# Patient Record
Sex: Female | Born: 1965 | Race: White | Hispanic: No | Marital: Married | Smoking: Former smoker
Health system: Southern US, Community
[De-identification: ages and names within clinical notes are randomized; demographics above are authoritative.]

## PROBLEM LIST (undated history)

## (undated) DIAGNOSIS — R2231 Localized swelling, mass and lump, right upper limb: Secondary | ICD-10-CM

## (undated) DIAGNOSIS — F419 Anxiety disorder, unspecified: Secondary | ICD-10-CM

## (undated) DIAGNOSIS — K635 Polyp of colon: Secondary | ICD-10-CM

## (undated) DIAGNOSIS — G5601 Carpal tunnel syndrome, right upper limb: Secondary | ICD-10-CM

## (undated) DIAGNOSIS — Z973 Presence of spectacles and contact lenses: Secondary | ICD-10-CM

## (undated) DIAGNOSIS — M8580 Other specified disorders of bone density and structure, unspecified site: Secondary | ICD-10-CM

## (undated) HISTORY — PX: TONSILLECTOMY: SUR1361

## (undated) HISTORY — DX: Other specified disorders of bone density and structure, unspecified site: M85.80

## (undated) HISTORY — DX: Polyp of colon: K63.5

## (undated) HISTORY — DX: Anxiety disorder, unspecified: F41.9

---

## 1998-10-25 ENCOUNTER — Inpatient Hospital Stay (HOSPITAL_COMMUNITY)
Admission: AD | Admit: 1998-10-25 | Discharge: 1998-10-25 | Payer: Self-pay | Admitting: Physical Medicine & Rehabilitation

## 1998-11-06 ENCOUNTER — Inpatient Hospital Stay (HOSPITAL_COMMUNITY): Admission: AD | Admit: 1998-11-06 | Discharge: 1998-11-10 | Payer: Self-pay | Admitting: Obstetrics & Gynecology

## 1998-12-13 ENCOUNTER — Other Ambulatory Visit: Admission: RE | Admit: 1998-12-13 | Discharge: 1998-12-13 | Payer: Self-pay | Admitting: Obstetrics and Gynecology

## 1998-12-25 ENCOUNTER — Inpatient Hospital Stay (HOSPITAL_COMMUNITY): Admission: AD | Admit: 1998-12-25 | Discharge: 1998-12-25 | Payer: Self-pay | Admitting: Obstetrics and Gynecology

## 2000-05-29 ENCOUNTER — Other Ambulatory Visit: Admission: RE | Admit: 2000-05-29 | Discharge: 2000-05-29 | Payer: Self-pay | Admitting: Obstetrics and Gynecology

## 2001-08-11 ENCOUNTER — Other Ambulatory Visit: Admission: RE | Admit: 2001-08-11 | Discharge: 2001-08-11 | Payer: Self-pay | Admitting: Obstetrics and Gynecology

## 2002-04-07 ENCOUNTER — Encounter (INDEPENDENT_AMBULATORY_CARE_PROVIDER_SITE_OTHER): Payer: Self-pay | Admitting: Specialist

## 2002-04-07 ENCOUNTER — Observation Stay (HOSPITAL_COMMUNITY): Admission: RE | Admit: 2002-04-07 | Discharge: 2002-04-08 | Payer: Self-pay | Admitting: Physical Therapy

## 2002-04-07 HISTORY — PX: LAPAROSCOPIC ASSISTED VAGINAL HYSTERECTOMY: SHX5398

## 2003-06-16 ENCOUNTER — Other Ambulatory Visit: Admission: RE | Admit: 2003-06-16 | Discharge: 2003-06-16 | Payer: Self-pay | Admitting: Obstetrics and Gynecology

## 2004-07-03 ENCOUNTER — Other Ambulatory Visit: Admission: RE | Admit: 2004-07-03 | Discharge: 2004-07-03 | Payer: Self-pay | Admitting: Obstetrics and Gynecology

## 2004-08-05 ENCOUNTER — Ambulatory Visit (HOSPITAL_COMMUNITY): Admission: RE | Admit: 2004-08-05 | Discharge: 2004-08-05 | Payer: Self-pay | Admitting: Internal Medicine

## 2007-07-06 ENCOUNTER — Ambulatory Visit (HOSPITAL_COMMUNITY): Admission: RE | Admit: 2007-07-06 | Discharge: 2007-07-06 | Payer: Self-pay | Admitting: Internal Medicine

## 2007-07-09 ENCOUNTER — Ambulatory Visit (HOSPITAL_COMMUNITY): Admission: RE | Admit: 2007-07-09 | Discharge: 2007-07-09 | Payer: Self-pay | Admitting: Internal Medicine

## 2007-08-09 ENCOUNTER — Ambulatory Visit (HOSPITAL_COMMUNITY): Admission: RE | Admit: 2007-08-09 | Discharge: 2007-08-09 | Payer: Self-pay | Admitting: Internal Medicine

## 2007-08-27 ENCOUNTER — Encounter: Payer: Self-pay | Admitting: Endocrinology

## 2007-09-24 ENCOUNTER — Encounter: Payer: Self-pay | Admitting: Endocrinology

## 2007-10-07 ENCOUNTER — Ambulatory Visit: Payer: Self-pay | Admitting: Endocrinology

## 2007-10-07 DIAGNOSIS — D3500 Benign neoplasm of unspecified adrenal gland: Secondary | ICD-10-CM | POA: Insufficient documentation

## 2007-10-11 ENCOUNTER — Ambulatory Visit: Payer: Self-pay | Admitting: Endocrinology

## 2007-10-12 ENCOUNTER — Telehealth: Payer: Self-pay | Admitting: Endocrinology

## 2007-10-13 ENCOUNTER — Ambulatory Visit: Payer: Self-pay | Admitting: Cardiology

## 2007-10-13 LAB — CONVERTED CEMR LAB: Cortisol, Plasma: 1.3 ug/dL

## 2007-10-15 LAB — CONVERTED CEMR LAB: Aldosterone, Serum: 1

## 2007-10-18 LAB — CONVERTED CEMR LAB
Metaneph Total, Ur: 251 ug/24hr (ref 182–739)
Metanephrines, Ur: 93 (ref 58–203)
Normetanephrine, 24H Ur: 158 (ref 88–649)

## 2010-11-15 NOTE — Discharge Summary (Signed)
   NAME:  Dominique Sandoval, Dominique Sandoval                         ACCOUNT NO.:  [id number]   MEDICAL RECORD NO.:  [id number]                   PATIENT TYPE:  OBV   LOCATION:  9303                                 FACILITY:  [facility]   PHYSICIAN:  Dineen Kid. Rana Snare, M.D.                 DATE OF BIRTH:  01/09/1966   DATE OF ADMISSION:  04/07/2002  DATE OF DISCHARGE:  04/08/2002                                 DISCHARGE SUMMARY   HISTORY OF PRESENT ILLNESS:  The patient is a 45 year old with worsening  pelvic pain, dyspareunia, and abnormal uterine bleeding and presumed  fibroids presented for definitive surgery, planned laparoscopic-assisted  vaginal hysterectomy.   HOSPITAL COURSE:  The patient underwent a laparoscopic-assisted vaginal  hysterectomy which was uncomplicated.  At the same time a left salpingectomy  was performed due to a left paratubal cyst.  Estimated blood loss during the  surgery was 100 cc.  Her postoperative course was unremarkable with good  return of bowel function.  She was ambulating, tolerating a regular diet.  On postoperative day #1, her postoperative hemoglobin was 11.7, abdomen was  soft and nontender.  Incision was clean, dry, and intact and good active  bowel sounds.  The patient was discharged home.   DISPOSITION:  The patient will be discharged home to follow up in the office  in 1-2 weeks.   DISCHARGE INSTRUCTIONS:  Told to return for increasing pain, bleeding, or  fever greater than 100.5.   DISCHARGE MEDICATIONS:  She was sent home with a prescription for Tylox,  dispense #30.                                               Dineen Kid Rana Snare, M.D.    DCL/MEDQ  D:  04/08/2002  T:  04/09/2002  Job:  [id number]

## 2010-11-15 NOTE — H&P (Signed)
   NAME:  Dominique Sandoval, Dominique Sandoval                         ACCOUNT NO.:  [id number]   MEDICAL RECORD NO.:  [id number]                   PATIENT TYPE:  AMB   LOCATION:  SDC                                  FACILITY:  [facility]   PHYSICIAN:  Dineen Kid. Rana Snare, M.D.                 DATE OF BIRTH:  1966/02/14   DATE OF ADMISSION:  04/07/2002  DATE OF DISCHARGE:                                HISTORY & PHYSICAL   HISTORY OF PRESENT ILLNESS:  The patient is a 45 year old gravida 1, para 1,  with worsening abnormal uterine bleeding, pelvic pain, and dyspareunia.  She  has tried multiple oral contraceptive agents and continues to change pads  three to four times a day.  Ranges from this to more nuisance bleeding. She  continues to have pain which is intermittent, but definite dyspareunia which  has prevented her from being sexually active.  She has had an ultrasound in  the past which shows an enlarged uterus, question of underlying fibroids, a  normal sonohysterogram.  She has failed conservative therapy with oral  contraceptive agents and anti-inflammatory medications, has no future  childbearing desires, and presents today for definitive surgical treatment.  Plans hysterectomy.   PAST MEDICAL HISTORY:  Negative.   PAST SURGICAL HISTORY:  She had a tonsillectomy at age 29 and she had a  cesarean section.   MEDICATIONS:  1. Triphasil.  2. Anti-inflammatory medications.   ALLERGIES:  No known drug allergies.   PHYSICAL EXAMINATION:  VITAL SIGNS: Blood pressure 110/64.  HEART: Regular rate and rhythm.  LUNGS:  Clear to auscultation bilaterally.  ABDOMEN: Nondistended and nontender.  PELVIC:  Uterus is anteverted, mobile, 8 to 10 weeks size with irregular  surface. No adnexal masses are palpable. She does have mild tenderness to  deep palpation. The cervix is grossly normal in appearance.   IMPRESSION:  Enlarged uterus, probable fibroids, abnormal uterine bleeding,  pelvic pain and dyspareunia not  responsive to conservative medical  management.  The patient desires definitive surgical intervention, planned  laparoscopically-assisted vaginal hysterectomy with preservation of the  ovaries.  The risks and benefits were discussed at length which include, but  not limited to, risk of infection, bleeding, damage to bowel, bladder, or  ureters, possibly not being able to alleviate the pelvic pain, risks  associated with blood transfusion and anesthesia.  The patient does give her  informed consent.                                               Dineen Kid Rana Snare, M.D.    DCL/MEDQ  D:  04/07/2002  T:  04/07/2002  Job:  [id number]

## 2010-11-15 NOTE — Op Note (Signed)
NAME:  Dominique Sandoval, Dominique Sandoval                         ACCOUNT NO.:  [id number]   MEDICAL RECORD NO.:  [id number]                   PATIENT TYPE:  AMB   LOCATION:  SDC                                  FACILITY:  [facility]   PHYSICIAN:  Dineen Kid. Rana Snare, M.D.                 DATE OF BIRTH:  13-Jun-1966   DATE OF PROCEDURE:  04/07/2002  DATE OF DISCHARGE:                                 OPERATIVE REPORT   PREOPERATIVE DIAGNOSES:  1. Abnormal uterine bleeding.  2. Enlarged uterus.  3. Pelvic pain.  4. Dyspareunia.   POSTOPERATIVE DIAGNOSES:  1. Abnormal uterine bleeding.  2. Enlarged uterus.  3. Pelvic pain.  4. Dyspareunia.  5. Paratubal cyst on the left.   PROCEDURE:  Laparoscopically-assisted vaginal hysterectomy with left  salpingectomy.   SURGEON:  Dineen Kid. Rana Snare, M.D.   ASSISTANT:  Raynald Kemp, M.D.   ANESTHESIA:  General endotracheal.   INDICATIONS:  The patient is a 45 year old G1, P1, with worsening pelvic  pain, dyspareunia, and abnormal bleeding not responsive to conservative  medical management, which has included oral contraceptive agents and anti-  inflammatory medications.  She had a sonohysterogram which showed no  endometrial pathology, but she does continue to have an enlarged uterus,  which probably represents fibroids.  She presents today for laparoscopically-  assisted vaginal hysterectomy.  Risks and benefits were discussed.  Informed  consent was obtained.   FINDINGS:  A normal-appearing appendix and liver.  A left paratubal cyst  approximately 3 cm in size.  Otherwise normal-appearing ovaries.  Slightly  enlarged uterus.   DESCRIPTION OF PROCEDURE:  After adequate anesthesia, the patient was placed  in the dorsal lithotomy position.  She was sterilely prepped and draped.  The bladder was sterilely drained.  A Cohen tenaculum was placed on the  anterior lip of the cervix.  A 1 cm infraumbilical skin incision was made,  and a Veress needle was insufflated.  After  it was insufflated with dullness  to percussion, an 11 mm trocar was inserted, a 5 mm trocar was inserted to  the left of midline, two _______________ under direct visualization, and the  above findings were noted.  The left paratubal cyst was grasped.  It was  cauterized with a Jarvis tripolar cautery.  The remaining portion of the  left fallopian tube was then dissected from the mesosalpinx, cauterized, and  cut.  The uterosacral ligaments were then cauterized and cut down across to  the round ligament.  This was done bilaterally and good hemostasis achieved.  The paratubal cyst and remaining portion of the fallopian tube were placed  in the cul-de-sac.  At this time the legs were elevated.  A weighted  speculum was placed in the vagina.  The abdomen had been desufflated.  A  posterior colpotomy incision was made.  The paratubal cyst and fallopian  tube from the left were easily removed.  The cervix was circumscribed with  Bovie cautery.  A LigaSure cauterization unit was used to cauterize the  uterosacral ligaments bilaterally, the bladder pillars bilaterally.  The  bladder was dissected off the anterior surface of the cervix, the anterior  peritoneum was entered, and the Deaver retractor was placed underneath the  bladder.  Successive bites across the uterine vasculature, up across the  inferior portion of the broad ligaments were performed bilaterally with the  LigaSure and then cut with Mayo scissors, with good hemostasis achieved.  At  this point the uterus was removed.  The uterosacral ligaments were ligated  with 0 Monocryl suture.  A ribbon pack was placed, and the posterior  peritoneum was closed with a pursestring stitch of 0 Monocryl.  The  posterior vaginal mucosa was then closed in vertical fashion using figure-of-  eights of 0 Monocryl.  The packing was then removed and the anterior vaginal  mucosa was then closed with figure-of-eights of 0 Monocryl with good  approximation  and good hemostasis.  A Foley catheter was then placed with  return of clear yellow urine.  The abdomen was reinsufflated, where  examination with the laparoscope, revealed hemostasis was adequate, however  small amounts of peritoneal bleeding were coagulated with the [facility]  bipolar.  After adequate hemostasis was assured, the trocars were then  removed, where examination again showed good hemostasis at this time.  The  trocar was removed.  The infraumbilical skin incision was closed with a 0  Vicryl in the fascia and a 3-0 Vicryl Rapide subcuticular stitch in the  skin.  The 5 mm trocar site was closed with a 3-0 Vicryl Rapide subcuticular  stitch.  Incisions were injected with 0.25% Marcaine.  The patient received  1 g of Cefotetan preoperatively.  The patient tolerated the procedure well,  was stable on transfer to the recovery room.  The sponge and instrument  counts were correct x3.  Estimated blood loss during the procedure was 100  cc.                                               Dineen Kid Rana Snare, M.D.    DCL/MEDQ  D:  04/07/2002  T:  04/07/2002  Job:  [id number]

## 2010-12-20 ENCOUNTER — Other Ambulatory Visit: Payer: Self-pay | Admitting: Dermatology

## 2012-04-12 ENCOUNTER — Other Ambulatory Visit: Payer: Self-pay | Admitting: Dermatology

## 2014-01-25 ENCOUNTER — Encounter (HOSPITAL_BASED_OUTPATIENT_CLINIC_OR_DEPARTMENT_OTHER): Payer: Self-pay | Admitting: *Deleted

## 2014-01-26 ENCOUNTER — Encounter (HOSPITAL_BASED_OUTPATIENT_CLINIC_OR_DEPARTMENT_OTHER): Payer: Self-pay | Admitting: *Deleted

## 2014-01-26 NOTE — Progress Notes (Signed)
NPO AFTER MN WITH EXCEPTION CLEAR LIQUIDS UNTIL 0700 (NO CREAM/ MILK PRODUCTS).  ARRIVE AT 1130. NEEDS HG. 

## 2014-02-02 ENCOUNTER — Ambulatory Visit (HOSPITAL_BASED_OUTPATIENT_CLINIC_OR_DEPARTMENT_OTHER)
Admission: RE | Admit: 2014-02-02 | Discharge: 2014-02-02 | Disposition: A | Discharge status: Home / Self Care | Payer: 59 | Source: Ambulatory Visit | Attending: Orthopedic Surgery | Admitting: Orthopedic Surgery

## 2014-02-02 ENCOUNTER — Encounter (HOSPITAL_BASED_OUTPATIENT_CLINIC_OR_DEPARTMENT_OTHER): Payer: Self-pay | Admitting: Anesthesiology

## 2014-02-02 ENCOUNTER — Ambulatory Visit (HOSPITAL_BASED_OUTPATIENT_CLINIC_OR_DEPARTMENT_OTHER): Payer: 59 | Admitting: Anesthesiology

## 2014-02-02 ENCOUNTER — Encounter (HOSPITAL_BASED_OUTPATIENT_CLINIC_OR_DEPARTMENT_OTHER): Payer: 59 | Admitting: Anesthesiology

## 2014-02-02 ENCOUNTER — Encounter (HOSPITAL_BASED_OUTPATIENT_CLINIC_OR_DEPARTMENT_OTHER)
Admission: RE | Disposition: A | Discharge status: Home / Self Care | Payer: Self-pay | Source: Ambulatory Visit | Attending: Orthopedic Surgery

## 2014-02-02 DIAGNOSIS — G5600 Carpal tunnel syndrome, unspecified upper limb: Secondary | ICD-10-CM | POA: Insufficient documentation

## 2014-02-02 DIAGNOSIS — G5601 Carpal tunnel syndrome, right upper limb: Secondary | ICD-10-CM

## 2014-02-02 DIAGNOSIS — Z888 Allergy status to other drugs, medicaments and biological substances status: Secondary | ICD-10-CM | POA: Diagnosis not present

## 2014-02-02 DIAGNOSIS — M6740 Ganglion, unspecified site: Secondary | ICD-10-CM | POA: Diagnosis not present

## 2014-02-02 DIAGNOSIS — Z87891 Personal history of nicotine dependence: Secondary | ICD-10-CM | POA: Insufficient documentation

## 2014-02-02 HISTORY — DX: Presence of spectacles and contact lenses: Z97.3

## 2014-02-02 HISTORY — PX: CARPAL TUNNEL RELEASE: SHX101

## 2014-02-02 HISTORY — DX: Carpal tunnel syndrome, right upper limb: G56.01

## 2014-02-02 HISTORY — DX: Localized swelling, mass and lump, right upper limb: R22.31

## 2014-02-02 HISTORY — PX: MASS EXCISION: SHX2000

## 2014-02-02 LAB — POCT HEMOGLOBIN-HEMACUE: HEMOGLOBIN: 13.4 g/dL (ref 12.0–15.0)

## 2014-02-02 SURGERY — EXCISION MASS
Anesthesia: Monitor Anesthesia Care | Site: Wrist | Laterality: Right

## 2014-02-02 MED ORDER — MIDAZOLAM HCL 2 MG/2ML IJ SOLN
INTRAMUSCULAR | Status: AC
  Filled 2014-02-02: qty 2

## 2014-02-02 MED ORDER — DEXAMETHASONE SODIUM PHOSPHATE 10 MG/ML IJ SOLN
INTRAMUSCULAR | Status: DC | PRN
  Administered 2014-02-02: 10 mg via INTRAVENOUS

## 2014-02-02 MED ORDER — MEPERIDINE HCL 25 MG/ML IJ SOLN
6.2500 mg | INTRAMUSCULAR | Status: DC | PRN
  Filled 2014-02-02: qty 1

## 2014-02-02 MED ORDER — PROPOFOL 10 MG/ML IV BOLUS
INTRAVENOUS | Status: DC | PRN
  Administered 2014-02-02: 200 mg via INTRAVENOUS

## 2014-02-02 MED ORDER — LIDOCAINE HCL (CARDIAC) 20 MG/ML IV SOLN
INTRAVENOUS | Status: DC | PRN
  Administered 2014-02-02: 80 mg via INTRAVENOUS

## 2014-02-02 MED ORDER — HYDROMORPHONE HCL PF 1 MG/ML IJ SOLN
INTRAMUSCULAR | Status: AC
  Filled 2014-02-02: qty 1

## 2014-02-02 MED ORDER — OXYCODONE HCL 5 MG/5ML PO SOLN
5.0000 mg | Freq: Once | ORAL | Status: DC | PRN
  Filled 2014-02-02: qty 5

## 2014-02-02 MED ORDER — MIDAZOLAM HCL 5 MG/5ML IJ SOLN
INTRAMUSCULAR | Status: DC | PRN
  Administered 2014-02-02: 2 mg via INTRAVENOUS

## 2014-02-02 MED ORDER — FENTANYL CITRATE 0.05 MG/ML IJ SOLN
INTRAMUSCULAR | Status: DC | PRN
  Administered 2014-02-02 (×2): 50 ug via INTRAVENOUS

## 2014-02-02 MED ORDER — ONDANSETRON HCL 4 MG/2ML IJ SOLN
INTRAMUSCULAR | Status: DC | PRN
  Administered 2014-02-02: 4 mg via INTRAVENOUS

## 2014-02-02 MED ORDER — HYDROCODONE-ACETAMINOPHEN 5-300 MG PO TABS: 1.0000 | ORAL_TABLET | Freq: Four times a day (QID) | ORAL | Status: DC | PRN

## 2014-02-02 MED ORDER — LIDOCAINE HCL (PF) 1 % IJ SOLN
INTRAMUSCULAR | Status: DC | PRN
  Administered 2014-02-02: 5 mL

## 2014-02-02 MED ORDER — CEFAZOLIN SODIUM-DEXTROSE 2-3 GM-% IV SOLR
INTRAVENOUS | Status: AC
  Filled 2014-02-02: qty 50

## 2014-02-02 MED ORDER — HYDROMORPHONE HCL PF 1 MG/ML IJ SOLN
0.2500 mg | INTRAMUSCULAR | Status: DC | PRN
  Administered 2014-02-02: 0.25 mg via INTRAVENOUS
  Filled 2014-02-02: qty 1

## 2014-02-02 MED ORDER — STERILE WATER FOR IRRIGATION IR SOLN
Status: DC | PRN
  Administered 2014-02-02: 1

## 2014-02-02 MED ORDER — ACETAMINOPHEN 10 MG/ML IV SOLN
INTRAVENOUS | Status: DC | PRN
  Administered 2014-02-02: 1000 mg via INTRAVENOUS

## 2014-02-02 MED ORDER — BUPIVACAINE HCL (PF) 0.25 % IJ SOLN
INTRAMUSCULAR | Status: DC | PRN
  Administered 2014-02-02: 5 mL

## 2014-02-02 MED ORDER — PROMETHAZINE HCL 25 MG/ML IJ SOLN
6.2500 mg | INTRAMUSCULAR | Status: DC | PRN
  Administered 2014-02-02: 6.25 mg via INTRAVENOUS
  Filled 2014-02-02: qty 1

## 2014-02-02 MED ORDER — LACTATED RINGERS IV SOLN
INTRAVENOUS | Status: DC
  Administered 2014-02-02 (×2): via INTRAVENOUS
  Filled 2014-02-02: qty 1000

## 2014-02-02 MED ORDER — DOCUSATE SODIUM 100 MG PO CAPS: 100.0000 mg | ORAL_CAPSULE | Freq: Two times a day (BID) | ORAL | Status: DC

## 2014-02-02 MED ORDER — CEFAZOLIN SODIUM-DEXTROSE 2-3 GM-% IV SOLR
2.0000 g | INTRAVENOUS | Status: AC
  Administered 2014-02-02: 2 g via INTRAVENOUS
  Filled 2014-02-02: qty 50

## 2014-02-02 MED ORDER — OXYCODONE HCL 5 MG PO TABS
5.0000 mg | ORAL_TABLET | Freq: Once | ORAL | Status: DC | PRN
  Filled 2014-02-02: qty 1

## 2014-02-02 MED ORDER — PROMETHAZINE HCL 25 MG/ML IJ SOLN
INTRAMUSCULAR | Status: AC
  Filled 2014-02-02: qty 1

## 2014-02-02 MED ORDER — CHLORHEXIDINE GLUCONATE 4 % EX LIQD
60.0000 mL | Freq: Once | CUTANEOUS | Status: DC
  Filled 2014-02-02: qty 60

## 2014-02-02 MED ORDER — FENTANYL CITRATE 0.05 MG/ML IJ SOLN
INTRAMUSCULAR | Status: AC
  Filled 2014-02-02: qty 4

## 2014-02-02 SURGICAL SUPPLY — 77 items
APL SKNCLS STERI-STRIP NONHPOA (GAUZE/BANDAGES/DRESSINGS) ×2
BANDAGE ELASTIC 3 VELCRO ST LF (GAUZE/BANDAGES/DRESSINGS) ×3 IMPLANT
BANDAGE ELASTIC 4 VELCRO ST LF (GAUZE/BANDAGES/DRESSINGS) IMPLANT
BENZOIN TINCTURE PRP APPL 2/3 (GAUZE/BANDAGES/DRESSINGS) ×1 IMPLANT
BLADE MINI RND TIP GREEN BEAV (BLADE) IMPLANT
BLADE SURG 15 STRL LF DISP TIS (BLADE) ×2 IMPLANT
BLADE SURG 15 STRL SS (BLADE) ×6
BNDG CMPR 9X4 STRL LF SNTH (GAUZE/BANDAGES/DRESSINGS) ×2
BNDG CONFORM 3 STRL LF (GAUZE/BANDAGES/DRESSINGS) ×3 IMPLANT
BNDG ESMARK 4X9 LF (GAUZE/BANDAGES/DRESSINGS) ×3 IMPLANT
BNDG GAUZE ELAST 4 BULKY (GAUZE/BANDAGES/DRESSINGS) IMPLANT
CANISTER SUCTION 1200CC (MISCELLANEOUS) ×2 IMPLANT
CORDS BIPOLAR (ELECTRODE) ×3 IMPLANT
COVER MAYO STAND STRL (DRAPES) ×3 IMPLANT
COVER TABLE BACK 60X90 (DRAPES) ×3 IMPLANT
CUFF TOURNIQUET SINGLE 18IN (TOURNIQUET CUFF) IMPLANT
DECANTER SPIKE VIAL GLASS SM (MISCELLANEOUS) IMPLANT
DRAIN PENROSE 18X1/2 LTX STRL (DRAIN) ×1 IMPLANT
DRAPE EXTREMITY T 121X128X90 (DRAPE) ×3 IMPLANT
DRAPE LG THREE QUARTER DISP (DRAPES) ×6 IMPLANT
DRAPE SURG 17X23 STRL (DRAPES) ×3 IMPLANT
DRAPE U-SHAPE 47X51 STRL (DRAPES) IMPLANT
DRSG EMULSION OIL 3X3 NADH (GAUZE/BANDAGES/DRESSINGS) IMPLANT
GAUZE SPONGE 4X4 16PLY XRAY LF (GAUZE/BANDAGES/DRESSINGS) IMPLANT
GAUZE XEROFORM 1X8 LF (GAUZE/BANDAGES/DRESSINGS) ×3 IMPLANT
GLOVE BIO SURGEON STRL SZ8 (GLOVE) ×3 IMPLANT
GLOVE BIOGEL PI IND STRL 7.0 (GLOVE) IMPLANT
GLOVE BIOGEL PI IND STRL 7.5 (GLOVE) ×1 IMPLANT
GLOVE BIOGEL PI IND STRL 8.5 (GLOVE) ×2 IMPLANT
GLOVE BIOGEL PI INDICATOR 7.0 (GLOVE) ×1
GLOVE BIOGEL PI INDICATOR 7.5 (GLOVE) ×1
GLOVE BIOGEL PI INDICATOR 8.5 (GLOVE) ×1
GLOVE SURG ORTHO 8.0 STRL STRW (GLOVE) ×3 IMPLANT
GLOVE SURG SS PI 7.5 STRL IVOR (GLOVE) ×2 IMPLANT
GOWN STRL REUS W/ TWL LRG LVL3 (GOWN DISPOSABLE) ×2 IMPLANT
GOWN STRL REUS W/ TWL XL LVL3 (GOWN DISPOSABLE) ×2 IMPLANT
GOWN STRL REUS W/TWL LRG LVL3 (GOWN DISPOSABLE)
GOWN STRL REUS W/TWL XL LVL3 (GOWN DISPOSABLE) ×2 IMPLANT
KNIFE CARPAL TUNNEL (BLADE) IMPLANT
LOOP VESSEL MAXI BLUE (MISCELLANEOUS) IMPLANT
NDL HYPO 25X1 1.5 SAFETY (NEEDLE) ×4 IMPLANT
NDL SAFETY ECLIPSE 18X1.5 (NEEDLE) ×4 IMPLANT
NEEDLE 27GAX1X1/2 (NEEDLE) IMPLANT
NEEDLE HYPO 18GX1.5 SHARP (NEEDLE) ×6
NEEDLE HYPO 25X1 1.5 SAFETY (NEEDLE) ×6 IMPLANT
NS IRRIG 500ML POUR BTL (IV SOLUTION) ×1 IMPLANT
PACK BASIN DAY SURGERY FS (CUSTOM PROCEDURE TRAY) ×3 IMPLANT
PAD ALCOHOL SWAB (MISCELLANEOUS) ×12 IMPLANT
PAD CAST 3X4 CTTN HI CHSV (CAST SUPPLIES) IMPLANT
PAD CAST 4YDX4 CTTN HI CHSV (CAST SUPPLIES) IMPLANT
PADDING CAST ABS 3INX4YD NS (CAST SUPPLIES) ×1
PADDING CAST ABS 4INX4YD NS (CAST SUPPLIES) ×1
PADDING CAST ABS COTTON 3X4 (CAST SUPPLIES) IMPLANT
PADDING CAST ABS COTTON 4X4 ST (CAST SUPPLIES) ×2 IMPLANT
PADDING CAST COTTON 3X4 STRL (CAST SUPPLIES)
PADDING CAST COTTON 4X4 STRL (CAST SUPPLIES)
SOAP 2% CHG 32OZ (WOUND CARE) ×3 IMPLANT
SPLINT FIBERGLASS 3X35 (CAST SUPPLIES) IMPLANT
SPLINT FIBERGLASS 4X30 (CAST SUPPLIES) IMPLANT
SPLINT PLASTER CAST XFAST 4X15 (CAST SUPPLIES) IMPLANT
SPLINT PLASTER XTRA FAST SET 4 (CAST SUPPLIES)
SPONGE GAUZE 4X4 12PLY STER LF (GAUZE/BANDAGES/DRESSINGS) ×3 IMPLANT
STOCKINETTE 4X48 STRL (DRAPES) ×3 IMPLANT
STRIP CLOSURE SKIN 1/2X4 (GAUZE/BANDAGES/DRESSINGS) ×2 IMPLANT
SUCTION FRAZIER TIP 10 FR DISP (SUCTIONS) ×2 IMPLANT
SUT ETHILON 5 0 P 3 18 (SUTURE) ×1
SUT MNCRL AB 3-0 PS2 18 (SUTURE) IMPLANT
SUT MNCRL AB 4-0 PS2 18 (SUTURE) ×2 IMPLANT
SUT NYLON ETHILON 5-0 P-3 1X18 (SUTURE) ×2 IMPLANT
SUT PROLENE 4 0 PS 2 18 (SUTURE) ×4 IMPLANT
SYR BULB 3OZ (MISCELLANEOUS) ×3 IMPLANT
SYR CONTROL 10ML LL (SYRINGE) ×6 IMPLANT
TOWEL OR 17X24 6PK STRL BLUE (TOWEL DISPOSABLE) ×6 IMPLANT
TRAY DSU PREP LF (CUSTOM PROCEDURE TRAY) ×3 IMPLANT
TUBE CONNECTING 12X1/4 (SUCTIONS) ×1 IMPLANT
UNDERPAD 30X30 INCONTINENT (UNDERPADS AND DIAPERS) ×3 IMPLANT
WATER STERILE IRR 500ML POUR (IV SOLUTION) ×2 IMPLANT

## 2014-02-02 NOTE — Anesthesia Procedure Notes (Signed)
Procedure Name: LMA Insertion Date/Time: 02/02/2014 1:35 PM Performed by: Wanita Chamberlain Pre-anesthesia Checklist: Patient identified, Patient being monitored, Emergency Drugs available, Timeout performed and Suction available Patient Re-evaluated:Patient Re-evaluated prior to inductionOxygen Delivery Method: Circle system utilized Preoxygenation: Pre-oxygenation with 100% oxygen Intubation Type: IV induction Ventilation: Mask ventilation without difficulty LMA: LMA inserted LMA Size: 4.0 Number of attempts: 1 Airway Equipment and Method: Bite block Placement Confirmation: breath sounds checked- equal and bilateral and positive ETCO2 Tube secured with: Tape Dental Injury: Teeth and Oropharynx as per pre-operative assessment  Comments: Small mouth

## 2014-02-02 NOTE — Anesthesia Preprocedure Evaluation (Addendum)
Anesthesia Evaluation  Patient identified by MRN, date of birth, ID band Patient awake    Reviewed: Allergy & Precautions, H&P , NPO status , Patient's Chart, lab work & pertinent test results  Airway Mallampati: II TM Distance: >3 FB Neck ROM: Full    Dental no notable dental hx. (+) Teeth Intact, Dental Advisory Given   Pulmonary former smoker,  breath sounds clear to auscultation  Pulmonary exam normal       Cardiovascular negative cardio ROS  Rhythm:Regular Rate:Normal     Neuro/Psych negative neurological ROS  negative psych ROS   GI/Hepatic negative GI ROS, Neg liver ROS,   Endo/Other  negative endocrine ROS  Renal/GU negative Renal ROS     Musculoskeletal negative musculoskeletal ROS (+)   Abdominal   Peds  Hematology negative hematology ROS (+)   Anesthesia Other Findings   Reproductive/Obstetrics negative OB ROS                         Anesthesia Physical Anesthesia Plan  ASA: I  Anesthesia Plan: General and MAC   Post-op Pain Management:    Induction: Intravenous  Airway Management Planned: LMA  Additional Equipment:   Intra-op Plan:   Post-operative Plan: Extubation in OR  Informed Consent: I have reviewed the patients History and Physical, chart, labs and discussed the procedure including the risks, benefits and alternatives for the proposed anesthesia with the patient or authorized representative who has indicated his/her understanding and acceptance.   Dental advisory given  Plan Discussed with: CRNA  Anesthesia Plan Comments:         Anesthesia Quick Evaluation

## 2014-02-02 NOTE — Anesthesia Postprocedure Evaluation (Signed)
Anesthesia Post Note  Patient: Dominique Sandoval  Procedure(s) Performed: Procedure(s) (LRB): RIGHT WRIST DEEP  MASS EXCISION  (Right) RIGHT CARPAL TUNNEL RELEASE (Right)  Anesthesia type: General  Patient location: PACU  Post pain: Pain level controlled  Post assessment: Post-op Vital signs reviewed  Last Vitals: BP 124/72  Pulse 61  Temp(Src) 36.1 C (Oral)  Resp 12  Ht 5\' 4"  (1.626 m)  Wt 151 lb 8 oz (68.72 kg)  BMI 25.99 kg/m2  SpO2 100%  Post vital signs: Reviewed  Level of consciousness: sedated  Complications: No apparent anesthesia complications

## 2014-02-02 NOTE — Transfer of Care (Signed)
Immediate Anesthesia Transfer of Care Note  Patient: Dominique Sandoval  Procedure(s) Performed: Procedure(s): RIGHT WRIST DEEP  MASS EXCISION  (Right) RIGHT CARPAL TUNNEL RELEASE (Right)  Patient Location: PACU  Anesthesia Type:General  Level of Consciousness: awake, alert , oriented and patient cooperative  Airway & Oxygen Therapy: Patient Spontanous Breathing and Patient connected to nasal cannula oxygen  Post-op Assessment: Report given to PACU RN and Post -op Vital signs reviewed and stable  Post vital signs: Reviewed and stable  Complications: No apparent anesthesia complications

## 2014-02-02 NOTE — H&P (Signed)
Dominique Sandoval is an 48 y.o. female.   Chief Complaint: right dorsal mass and numbness and tingling in right hand HPI: pt with longstanding mass over dorsum of wrist Pt also with s/s c/w right hand carpal tunnel Pt here for surgery No prior surgery to right wrist  Past Medical History  Diagnosis Date  . Carpal tunnel syndrome of right wrist   . Mass of right wrist   . Wears contact lenses     Past Surgical History  Procedure Laterality Date  . Laparoscopic assisted vaginal hysterectomy  04-07-2002    w/  left salpingectomy  . Tonsillectomy  age 89  . Cesarean section  may 2000    History reviewed. No pertinent family history. Social History:  reports that she quit smoking about 10 years ago. Her smoking use included Cigarettes. She has a 3.75 pack-year smoking history. She has never used smokeless tobacco. She reports that she drinks alcohol. She reports that she does not use illicit drugs.  Allergies:  Allergies  Allergen Reactions  . Robitussin (Alcohol Free) [Guaifenesin] Hives    Medications Prior to Admission  Medication Sig Dispense Refill  . Cholecalciferol (VITAMIN D3) 1000 UNITS CAPS Take 1 capsule by mouth daily.      . Omega-3 Fatty Acids (FISH OIL) 1000 MG CAPS Take 1 capsule by mouth daily.      . vitamin B-12 (CYANOCOBALAMIN) 1000 MCG tablet Take 1,000 mcg by mouth daily.        Results for orders placed during the hospital encounter of 02/02/14 (from the past 48 hour(s))  POCT HEMOGLOBIN-HEMACUE     Status: None   Collection Time    02/02/14 11:44 AM      Result Value Ref Range   Hemoglobin 13.4  12.0 - 15.0 g/dL   No results found.  ROS NO RECENT ILLNESSES OR HOSPITALIZATIONS  Blood pressure 111/74, pulse 70, temperature 97.3 F (36.3 C), temperature source Oral, resp. rate 14, height 5\' 4"  (1.626 m), weight 68.72 kg (151 lb 8 oz), SpO2 98.00%. Physical Exam  General Appearance:  Alert, cooperative, no distress, appears stated age  Head:   Normocephalic, without obvious abnormality, atraumatic  Eyes:  Pupils equal, conjunctiva/corneas clear,         Throat: Lips, mucosa, and tongue normal; teeth and gums normal  Neck: No visible masses     Lungs:   respirations unlabored  Chest Wall:  No tenderness or deformity  Heart:  Regular rate and rhythm,  Abdomen:   Soft, non-tender,         Extremities: RIGHT WRIST: DORSAL MASS, FINGER WARM WELL PERFUSED NL MUSCLE TONE IN HAND  Pulses: 2+ and symmetric  Skin: Skin color, texture, turgor normal, no rashes or lesions     Neurologic: Normal    Assessment/Plan RIGHT WRIST DORSAL MASS RIGHT HAND CARPAL TUNNEL SYNDROME  RIGHT WRIST DORSAL DEEP MASS EXCISION RIGHT HAND CARPAL TUNNEL RELEASE  R/B/A DISCUSSED WITH PT IN OFFICE.  PT VOICED UNDERSTANDING OF PLAN CONSENT SIGNED DAY OF SURGERY PT SEEN AND EXAMINED PRIOR TO OPERATIVE PROCEDURE/DAY OF SURGERY SITE MARKED. QUESTIONS ANSWERED WILL GO HOME FOLLOWING SURGERY  WE ARE PLANNING SURGERY FOR YOUR UPPER EXTREMITY. THE RISKS AND BENEFITS OF SURGERY INCLUDE BUT NOT LIMITED TO BLEEDING INFECTION, DAMAGE TO NEARBY NERVES ARTERIES TENDONS, FAILURE OF SURGERY TO ACCOMPLISH ITS INTENDED GOALS, PERSISTENT SYMPTOMS AND NEED FOR FURTHER SURGICAL INTERVENTION. WITH THIS IN MIND WE WILL PROCEED. I HAVE DISCUSSED WITH THE PATIENT THE PRE AND POSTOPERATIVE  REGIMEN AND THE DOS AND DON'TS. PT VOICED UNDERSTANDING AND INFORMED CONSENT SIGNED.  Linna Hoff 02/02/2014, 1:27 PM

## 2014-02-02 NOTE — Brief Op Note (Signed)
02/02/2014  1:29 PM  PATIENT:  Dominique Sandoval  48 y.o. female  PRE-OPERATIVE DIAGNOSIS:  RIGHT WRIST DEEP MASS,RIGHT CARPAL SYNDROME  POST-OPERATIVE DIAGNOSIS:  RIGHT WRIST DEEP MASS,RIGHT CARPAL SYNDROME  PROCEDURE:  Procedure(s): RIGHT WRIST DEEP  MASS EXCISION  (Right) RIGHT CARPAL TUNNEL RELEASE (Right)  SURGEON:  Surgeon(s) and Role:    * Linna Hoff, MD - Primary  PHYSICIAN ASSISTANT:   ASSISTANTS: none   ANESTHESIA:   general  EBL:     BLOOD ADMINISTERED:none  DRAINS: none   LOCAL MEDICATIONS USED:  MARCAINE     SPECIMEN:  No Specimen and Excision  DISPOSITION OF SPECIMEN:  PATHOLOGY  COUNTS:  YES  TOURNIQUET:    DICTATION: [id number]  PLAN OF CARE: Discharge to home after PACU  PATIENT DISPOSITION:  PACU - hemodynamically stable.   Delay start of Pharmacological VTE agent (>24hrs) due to surgical blood loss or risk of bleeding: not applicable

## 2014-02-02 NOTE — Discharge Instructions (Signed)
KEEP BANDAGE CLEAN AND DRY CALL OFFICE FOR F/U APPT [phone] DR [facility] CELL PHONE [phone] KEEP HAND ELEVATED ABOVE HEART OK TO APPLY ICE TO OPERATIVE AREA CONTACT OFFICE IF ANY WORSENING PAIN OR CONCERNS.        HAND SURGERY    HOME CARE INSTRUCTIONS    The following instructions have been prepared to help you care for yourself upon your return home today.  Wound Care:  Keep your hand elevated above the level of your heart. Do not allow it to dangle by your side. Keep the dressing dry and do not remove it unless your doctor advises you to do so. He will usually change it at the time of you post-op visit. Moving your fingers is advised to stimulate circulation but will depend on the site of your surgery. Of course, if you have a splint applied your doctor will advise you about movement.  Activity:  Do not drive or operate machinery today. Rest today and then you may return to your normal activity and work as indicated by your physician.  Diet: Drink liquids today or eat a light diet. You may resume a regular diet tomorrow.  General expectations: Pain for two or three days. Fingers may become slightly swollen.   Unexpected Observations- Call your doctor if any of these occur: Severe pain not relieved by pain medication. Elevated temperature. Dressing soaked with blood. Inability to move fingers. White or bluish color to fingers.      Post Anesthesia Home Care Instructions  Activity: Get plenty of rest for the remainder of the day. A responsible adult should stay with you for 24 hours following the procedure.  For the next 24 hours, DO NOT: -Drive a car -Paediatric nurse -Drink alcoholic beverages -Take any medication unless instructed by your physician -Make any legal decisions or sign important papers.  Meals: Start with liquid foods such as gelatin or soup. Progress to regular foods as tolerated. Avoid greasy, spicy, heavy foods. If nausea and/or vomiting occur,  drink only clear liquids until the nausea and/or vomiting subsides. Call your physician if vomiting continues.  Special Instructions/Symptoms: Your throat may feel dry or sore from the anesthesia or the breathing tube placed in your throat during surgery. If this causes discomfort, gargle with warm salt water. The discomfort should disappear within 24 hours.

## 2014-02-03 ENCOUNTER — Encounter (HOSPITAL_BASED_OUTPATIENT_CLINIC_OR_DEPARTMENT_OTHER): Payer: Self-pay | Admitting: Orthopedic Surgery

## 2014-02-03 NOTE — Op Note (Signed)
Dominique Sandoval, Dominique Sandoval               ACCOUNT NO.:  [id number]  MEDICAL RECORD NO.:  [id number]  LOCATION:                                 FACILITY:  PHYSICIAN:  Dominique Nakayama, MD  DATE OF BIRTH:  1966-03-06  DATE OF PROCEDURE:  02/02/2014 DATE OF DISCHARGE:  02/02/2014                              OPERATIVE REPORT   PREOPERATIVE DIAGNOSES: 1. Left right hand carpal tunnel syndrome. 2. Right wrist dorsal mass, dorsal ganglion.  POSTOPERATIVE DIAGNOSES: 1. Left right hand carpal tunnel syndrome. 2. Right wrist dorsal mass, dorsal ganglion.  ATTENDING PHYSICIAN:  Dominique Sandoval, M.D., who scrubbed and present for the entire procedure.  ASSISTANT SURGEON:  None.  ANESTHESIA:  General via LMA.  SURGICAL PROCEDURES: 1. Right hand carpal tunnel release. 2. Right wrist dorsal carpal ganglion excision.  ANESTHESIA:  General via LMA.  TOURNIQUET TIME:  Less than 30 minutes at 250 mmHg.  SURGICAL SPECIMENS:  Dorsal mass to Pathology.  SURGICAL INDICATIONS:  Dominique Sandoval is a 48 year old right-hand-dominant female with enlarging mass over the dorsal aspect of the right wrist. The patient also has signs and symptoms consistent with right hand carpal tunnel and would like to undergo the concomitant procedures. Risks, benefits, and alternatives were discussed in detail with the patient, and signed informed consent was obtained.  Risks include, but not limited to, bleeding, infection, damage to nearby nerves, arteries, or tendons, loss of motion of the wrist and digits, incomplete relief of symptoms, and need for further surgical intervention.  DESCRIPTION OF PROCEDURE:  The patient was properly identified in the preoperative holding area and marked with a permanent marker made on the right wrist to indicate correct operative site.  The patient was then brought back to the operating room, placed supine on the anesthesia room table.  General anesthesia was administered.  The  patient received preop antibiotics prior to skin incision.  A well-padded tourniquet was then placed on the right brachium and sealed with 1000 drape, and the right upper extremity was then prepped and draped in normal sterile fashion. Time-out was called, correct site was identified, and the procedure was then begun.  Attention was then turned to the right wrist where several cm incision was made directly in the mid palm.  The limb was then elevated and tourniquet insufflated.  Dissection was carried down through the skin and subcutaneous tissue.  Palmar fascia was then identified and sent and incised longitudinally.  Further exposure was then carried out on the distal transverse carpal ligament and under direct visualization, the distal 1/2 of the transverse carpal ligament released in its entirety.  Further exposure was then carried out proximally and distally to ensure complete release of the transverse carpal ligament as well as portion of the antebrachial fascia.  Careful protection of median nerve was done throughout.  The wound was then thoroughly irrigated.  After copious wound irrigation, the skin was then closed using horizontal mattress Prolene sutures.  Attention was then turned dorsally through a separate incision.  A longitudinal incision was made directly over the mass.  Deep dissection was carried out all the way down to the retinaculum.  Beneath the retinaculum,  the dorsal carpal ganglion was then identified.  Circumferential dissection was then carried around the mass and the mass was then incised after dissection all the way down and it appeared to be arising between the fourth and fifth extensor compartments.  After excision in the dorsal ganglion, the mass was then sent to Pathology.  The wound was then thoroughly irrigated.  Copious wound irrigation done and the subcutaneous tissues were then closed with Monocryl and skin was closed with a running 4-0 Prolene  subcuticular.  Benzoin and Steri-Strips were applied.  Sterile compressive bandage was then applied.  A 10 mL 0.25% Marcaine infiltrated over the carpal canal incision.  The patient was then placed in well-padded dressing and taken to the recovery room in good condition.  POSTPROCEDURE PLAN:  The patient was discharged home, will be seen back in the office in approximately 2 weeks for wound check, suture removal, to begin a postoperative carpal tunnel evaluation and treat, to go over the pathology.     Dominique Nakayama, MD     FWO/MEDQ  D:  02/02/2014  T:  02/02/2014  Job:  [id number]

## 2014-08-24 LAB — TSH: TSH: 2.23 u[IU]/mL (ref 0.41–5.90)

## 2014-09-06 ENCOUNTER — Other Ambulatory Visit: Payer: Self-pay | Admitting: Dermatology

## 2015-01-25 ENCOUNTER — Other Ambulatory Visit: Payer: Self-pay | Admitting: Obstetrics and Gynecology

## 2015-01-29 LAB — CYTOLOGY - PAP

## 2016-02-14 ENCOUNTER — Encounter: Payer: Self-pay | Admitting: *Deleted

## 2016-02-14 NOTE — Progress Notes (Signed)
Labs documented and scanned in.

## 2016-08-06 ENCOUNTER — Ambulatory Visit (INDEPENDENT_AMBULATORY_CARE_PROVIDER_SITE_OTHER): Payer: BLUE CROSS/BLUE SHIELD | Admitting: Physician Assistant

## 2016-08-06 ENCOUNTER — Encounter: Payer: Self-pay | Admitting: Physician Assistant

## 2016-08-06 VITALS — BP 111/69 | HR 69 | Temp 97.3°F | Ht 64.0 in | Wt 166.2 lb

## 2016-08-06 DIAGNOSIS — L219 Seborrheic dermatitis, unspecified: Secondary | ICD-10-CM

## 2016-08-06 DIAGNOSIS — M5030 Other cervical disc degeneration, unspecified cervical region: Secondary | ICD-10-CM

## 2016-08-06 DIAGNOSIS — G509 Disorder of trigeminal nerve, unspecified: Secondary | ICD-10-CM | POA: Diagnosis not present

## 2016-08-06 DIAGNOSIS — J0100 Acute maxillary sinusitis, unspecified: Secondary | ICD-10-CM

## 2016-08-06 MED ORDER — HYDROCORTISONE 2.5 % EX CREA: TOPICAL_CREAM | Freq: Two times a day (BID) | CUTANEOUS | 2 refills | Status: DC

## 2016-08-06 MED ORDER — FLUOCINONIDE 0.05 % EX CREA: TOPICAL_CREAM | Freq: Two times a day (BID) | CUTANEOUS | Status: DC

## 2016-08-06 MED ORDER — AMOXICILLIN 500 MG PO CAPS: 1000.0000 mg | ORAL_CAPSULE | Freq: Two times a day (BID) | ORAL | 0 refills | Status: DC

## 2016-08-06 NOTE — Patient Instructions (Signed)

## 2016-08-07 NOTE — Progress Notes (Signed)
BP 111/69   Pulse 69   Temp 97.3 F (36.3 C) (Oral)   Ht 5\' 4"  (1.626 m)   Wt 166 lb 3.2 oz (75.4 kg)   BMI 28.53 kg/m    Subjective:    Patient ID: Dominique Sandoval, female    DOB: September 18, 1965, 51 y.o.   MRN: [id number]  HPI: Dominique Sandoval is a 51 y.o. female presenting on 08/06/2016 for Sinusitis  Patient here to be established as new patient at [facility].  This patient is known to me from [facility]. This patient comes in for periodic recheck on medications and conditions. All medications are reviewed today. There are no reports of any problems with the medications. All of the medical conditions are reviewed and updated.  Lab work is reviewed and will be ordered as medically necessary.  This patient has had many days of sinus headache and postnasal drainage. There is copious drainage at times. Denies any fever at this time. There has been a history of sinus infections in the past.  No history of sinus surgery. There is cough at night. It has become more prevalent in recent days.   Past Medical History:  Diagnosis Date  . Carpal tunnel syndrome of right wrist   . Mass of right wrist   . Wears contact lenses    Relevant past medical, surgical, family and social history reviewed and updated as indicated. Interim medical history since our last visit reviewed. Allergies and medications reviewed and updated. DATA REVIEWED: CHART IN EPIC  Social History   Social History  . Marital status: Married    Spouse name: N/A  . Number of children: N/A  . Years of education: N/A   Occupational History  . Not on file.   Social History Main Topics  . Smoking status: Former Smoker    Packs/day: 0.25    Years: 15.00    Types: Cigarettes    Quit date: 01/27/2004  . Smokeless tobacco: Never Used  . Alcohol use Yes     Comment: occasional  . Drug use: No  . Sexual activity: Not on file   Other Topics Concern  . Not on file   Social History  Narrative  . No narrative on file    Past Surgical History:  Procedure Laterality Date  . CARPAL TUNNEL RELEASE Right 02/02/2014   Procedure: RIGHT CARPAL TUNNEL RELEASE;  Surgeon: Linna Hoff, MD;  Location: [facility];  Service: Orthopedics;  Laterality: Right;  . CESAREAN SECTION  may 2000  . LAPAROSCOPIC ASSISTED VAGINAL HYSTERECTOMY  04-07-2002   w/  left salpingectomy  . MASS EXCISION Right 02/02/2014   Procedure: RIGHT WRIST DEEP  MASS EXCISION ;  Surgeon: Linna Hoff, MD;  Location: [facility];  Service: Orthopedics;  Laterality: Right;  . TONSILLECTOMY  age 28    History reviewed. No pertinent family history.  Review of Systems  Constitutional: Negative.  Negative for activity change, fatigue and fever.  HENT: Positive for congestion, sinus pain and sinus pressure. Negative for sore throat.   Eyes: Negative.   Respiratory: Negative.  Negative for cough, shortness of breath and wheezing.   Cardiovascular: Negative.  Negative for chest pain.  Gastrointestinal: Negative.  Negative for abdominal pain.  Endocrine: Negative.   Genitourinary: Negative.  Negative for dysuria.  Musculoskeletal: Negative.   Skin: Negative.   Neurological: Negative.     Allergies as of 08/06/2016      Reactions  Robitussin (alcohol Free) [guaifenesin] Hives      Medication List       Accurate as of 08/06/16 11:59 PM. Always use your most recent med list.          amoxicillin 500 MG capsule Commonly known as:  AMOXIL Take 2 capsules (1,000 mg total) by mouth 2 (two) times daily.   hydrocortisone 2.5 % cream Apply topically 2 (two) times daily.   zolpidem 10 MG tablet Commonly known as:  AMBIEN Take 10 mg by mouth at bedtime as needed for sleep.          Objective:    BP 111/69   Pulse 69   Temp 97.3 F (36.3 C) (Oral)   Ht 5\' 4"  (1.626 m)   Wt 166 lb 3.2 oz (75.4 kg)   BMI 28.53 kg/m   Allergies  Allergen Reactions  . Robitussin  (Alcohol Free) [Guaifenesin] Hives    Wt Readings from Last 3 Encounters:  08/06/16 166 lb 3.2 oz (75.4 kg)  02/02/14 151 lb 8 oz (68.7 kg)  10/07/07 163 lb (73.9 kg)    Physical Exam  Constitutional: She is oriented to person, place, and time. She appears well-developed and well-nourished.  HENT:  Head: Normocephalic and atraumatic.  Right Ear: Tympanic membrane and external ear normal. No middle ear effusion.  Left Ear: Tympanic membrane and external ear normal.  No middle ear effusion.  Nose: Mucosal edema and rhinorrhea present. Right sinus exhibits no maxillary sinus tenderness. Left sinus exhibits no maxillary sinus tenderness.  Mouth/Throat: Uvula is midline. Posterior oropharyngeal erythema present.  Eyes: Conjunctivae and EOM are normal. Pupils are equal, round, and reactive to light. Right eye exhibits no discharge. Left eye exhibits no discharge.  Neck: Normal range of motion.  Cardiovascular: Normal rate, regular rhythm and normal heart sounds.   Pulmonary/Chest: Effort normal and breath sounds normal. No respiratory distress. She has no wheezes.  Abdominal: Soft.  Lymphadenopathy:    She has no cervical adenopathy.  Neurological: She is alert and oriented to person, place, and time.  Skin: Skin is warm and dry.  Psychiatric: She has a normal mood and affect.        Assessment & Plan:   1. Trigeminal nerve disorder  2. DDD (degenerative disc disease), cervical  3. Seborrhea - hydrocortisone 2.5 % cream; Apply topically 2 (two) times daily.  Dispense: 60 g; Refill: 2 - fluocinonide cream (LIDEX) 0.05 %; Apply topically 2 (two) times daily.  4. Acute non-recurrent maxillary sinusitis - amoxicillin (AMOXIL) 500 MG capsule; Take 2 capsules (1,000 mg total) by mouth 2 (two) times daily.  Dispense: 40 capsule; Refill: 0   Continue all other maintenance medications as listed above.  Follow up plan: Return if symptoms worsen or fail to improve.  Educational  handout given for sinusitis  Terald Sleeper PA-C [facility] [address]  [place], [place] [address] [phone]   08/07/2016, 10:04 PM

## 2016-08-18 ENCOUNTER — Telehealth: Payer: Self-pay | Admitting: Physician Assistant

## 2016-08-18 MED ORDER — NEOMYCIN-COLIST-HC-THONZONIUM 3.3-3-10-0.5 MG/ML OT SUSP: 3.0000 [drp] | Freq: Four times a day (QID) | OTIC | 2 refills | Status: DC

## 2016-08-18 MED ORDER — BETAMETHASONE DIPROPIONATE 0.05 % EX CREA: TOPICAL_CREAM | Freq: Two times a day (BID) | CUTANEOUS | 5 refills | Status: DC

## 2016-08-18 NOTE — Addendum Note (Signed)
Addended by: Terald Sleeper on: 08/18/2016 05:07 PM   Modules accepted: Orders

## 2016-08-18 NOTE — Telephone Encounter (Signed)
Pt insurance will not cover fluocinonide cream (LIDEX) 0.05 % wants to know what to do?

## 2016-08-19 NOTE — Telephone Encounter (Signed)
Patient aware.

## 2016-09-01 ENCOUNTER — Telehealth: Payer: Self-pay | Admitting: Physician Assistant

## 2016-09-01 MED ORDER — SULFAMETHOXAZOLE-TRIMETHOPRIM 800-160 MG PO TABS: 1.0000 | ORAL_TABLET | Freq: Two times a day (BID) | ORAL | 0 refills | Status: DC

## 2016-09-01 NOTE — Telephone Encounter (Signed)
Pt aware.

## 2016-09-01 NOTE — Telephone Encounter (Signed)
Pt c/o lower back pain that started last Wed. States she thinks it is a UTI. Pt states she always has blood in urine. Denies any burning with urine. Sometime has frequency and urgency. Pt is pushing fluids. Pt is asking for something to be called in to [facility]. Allergies NKDA

## 2016-09-03 ENCOUNTER — Ambulatory Visit (INDEPENDENT_AMBULATORY_CARE_PROVIDER_SITE_OTHER): Payer: BLUE CROSS/BLUE SHIELD | Admitting: Family Medicine

## 2016-09-03 VITALS — BP 121/75 | HR 69 | Temp 97.8°F | Ht 64.0 in | Wt 160.0 lb

## 2016-09-03 DIAGNOSIS — R300 Dysuria: Secondary | ICD-10-CM

## 2016-09-03 DIAGNOSIS — N3090 Cystitis, unspecified without hematuria: Secondary | ICD-10-CM | POA: Diagnosis not present

## 2016-09-03 LAB — URINALYSIS
BILIRUBIN UA: NEGATIVE
Glucose, UA: NEGATIVE
Ketones, UA: NEGATIVE
Leukocytes, UA: NEGATIVE
NITRITE UA: NEGATIVE
PROTEIN UA: NEGATIVE
Specific Gravity, UA: 1.015 (ref 1.005–1.030)
UUROB: 0.2 mg/dL (ref 0.2–1.0)
pH, UA: 6 (ref 5.0–7.5)

## 2016-09-03 MED ORDER — CIPROFLOXACIN HCL 500 MG PO TABS: 500.0000 mg | ORAL_TABLET | Freq: Two times a day (BID) | ORAL | 0 refills | Status: DC

## 2016-09-03 NOTE — Progress Notes (Signed)
Subjective:  Patient ID: Dominique Sandoval, female    DOB: 09/05/1965  Age: 51 y.o. MRN: [id number]  CC: Back Pain and urinary pressure   HPI Dominique Sandoval presents for Onset 6 days ago of low back pain. It is moderate in severity. It is just a dull ache. There is no sharpness. Patient developed an odor to the urine 2 days ago. Symptoms seem to get worse in spite of starting Bactrim DS 3 days ago. She has not done a urine culture prior to starting the Bactrim. She's had very few bladder infections in her lifetime. She is not having true dysuria or frequency. She did have some blood in her urine but it wasn't very much some time back on a routine gynecologic evaluation. The gynecologist said that it was insignificant at that time.  History Dominique Sandoval has a past medical history of Carpal tunnel syndrome of right wrist; Mass of right wrist; and Wears contact lenses.   She has a past surgical history that includes Laparoscopic assisted vaginal hysterectomy (04-07-2002); Tonsillectomy (age 34); Cesarean section (may 2000); Mass excision (Right, 02/02/2014); and Carpal tunnel release (Right, 02/02/2014).   Her family history is not on file.She reports that she quit smoking about 12 years ago. Her smoking use included Cigarettes. She has a 3.75 pack-year smoking history. She has never used smokeless tobacco. She reports that she drinks alcohol. She reports that she does not use drugs.  Current Outpatient Prescriptions on File Prior to Visit  Medication Sig Dispense Refill  . betamethasone dipropionate (DIPROLENE) 0.05 % cream Apply topically 2 (two) times daily. 30 g 5  . hydrocortisone 2.5 % cream Apply topically 2 (two) times daily. 60 g 2  . neomycin-colistin-hydrocortisone-thonzonium (CORTISPORIN-TC) 3.08-30-08-0.5 MG/ML otic suspension Place 3 drops into the left ear 4 (four) times daily. 10 mL 2  . sulfamethoxazole-trimethoprim (BACTRIM DS) 800-160 MG tablet Take 1 tablet by mouth 2 (two) times daily. 14  tablet 0  . zolpidem (AMBIEN) 10 MG tablet Take 10 mg by mouth at bedtime as needed for sleep.     No current facility-administered medications on file prior to visit.     ROS Review of Systems  Constitutional: Negative for chills, diaphoresis and fever.  HENT: Negative for congestion.   Eyes: Negative for visual disturbance.  Respiratory: Negative for cough and shortness of breath.   Cardiovascular: Negative for chest pain and palpitations.  Gastrointestinal: Negative for constipation, diarrhea and nausea.  Genitourinary: Negative for decreased urine volume, dysuria, flank pain, frequency, hematuria, menstrual problem, pelvic pain and urgency.  Musculoskeletal: Positive for back pain. Negative for arthralgias and joint swelling.  Skin: Negative for rash.  Neurological: Negative for dizziness and numbness.    Objective:  BP 121/75   Pulse 69   Temp 97.8 F (36.6 C) (Oral)   Ht 5\' 4"  (1.626 m)   Wt 160 lb (72.6 kg)   BMI 27.46 kg/m   Physical Exam  Constitutional: She is oriented to person, place, and time. She appears well-developed and well-nourished.  HENT:  Head: Normocephalic and atraumatic.  Cardiovascular: Normal rate and regular rhythm.   No murmur heard. Pulmonary/Chest: Effort normal and breath sounds normal.  Abdominal: Soft. Bowel sounds are normal. She exhibits no mass. There is no tenderness. There is no rebound and no guarding.  Musculoskeletal: She exhibits no tenderness.  Neurological: She is alert and oriented to person, place, and time.  Skin: Skin is warm and dry.  Psychiatric: She has a normal mood  and affect. Her behavior is normal.    Assessment & Plan:   Olivya was seen today for back pain and urinary pressure.  Diagnoses and all orders for this visit:  Cystitis  Dysuria -     Urinalysis -     Urine culture  Other orders -     ciprofloxacin (CIPRO) 500 MG tablet; Take 1 tablet (500 mg total) by mouth 2 (two) times daily.   I am  having Dominique Sandoval start on ciprofloxacin. I am also having her maintain her zolpidem, hydrocortisone, neomycin-colistin-hydrocortisone-thonzonium, betamethasone dipropionate, and sulfamethoxazole-trimethoprim.  Meds ordered this encounter  Medications  . ciprofloxacin (CIPRO) 500 MG tablet    Sig: Take 1 tablet (500 mg total) by mouth 2 (two) times daily.    Dispense:  10 tablet    Refill:  0     Follow-up: Return if symptoms worsen or fail to improve.  Claretta Fraise, M.D.

## 2016-09-06 LAB — URINE CULTURE: Organism ID, Bacteria: NO GROWTH

## 2016-09-15 ENCOUNTER — Telehealth: Payer: Self-pay | Admitting: *Deleted

## 2016-09-15 NOTE — Telephone Encounter (Signed)
Patient is still complaining with right side pain and it is not improving. She has gone to chiropractor, had a massage and none of this has helped. Patient advised that urine culture was normal.

## 2016-09-16 ENCOUNTER — Other Ambulatory Visit: Payer: Self-pay | Admitting: *Deleted

## 2016-09-16 ENCOUNTER — Telehealth: Payer: Self-pay | Admitting: Physician Assistant

## 2016-09-16 DIAGNOSIS — M549 Dorsalgia, unspecified: Principal | ICD-10-CM

## 2016-09-16 DIAGNOSIS — G8929 Other chronic pain: Secondary | ICD-10-CM

## 2016-09-16 NOTE — Telephone Encounter (Signed)
[facility] [place] [facility] appointment for back pain

## 2016-09-16 NOTE — Telephone Encounter (Signed)
Pt having further sxs of pain/tingling down thighs, would like labwork done. Appointment made for Friday to discuss sxs further and to possible have labs done before going to see specialist

## 2016-09-16 NOTE — Telephone Encounter (Signed)
Patient aware that referral has been place to orthopedic for back pain and to call back if she has not heard anything within 1-2 weeks

## 2016-09-19 ENCOUNTER — Ambulatory Visit (INDEPENDENT_AMBULATORY_CARE_PROVIDER_SITE_OTHER): Payer: BLUE CROSS/BLUE SHIELD | Admitting: Physician Assistant

## 2016-09-19 ENCOUNTER — Encounter: Payer: Self-pay | Admitting: Physician Assistant

## 2016-09-19 VITALS — BP 119/76 | HR 78 | Temp 97.6°F | Ht 64.0 in | Wt 160.0 lb

## 2016-09-19 DIAGNOSIS — F5101 Primary insomnia: Secondary | ICD-10-CM | POA: Diagnosis not present

## 2016-09-19 DIAGNOSIS — K5900 Constipation, unspecified: Secondary | ICD-10-CM | POA: Diagnosis not present

## 2016-09-19 DIAGNOSIS — F419 Anxiety disorder, unspecified: Secondary | ICD-10-CM

## 2016-09-19 MED ORDER — ZOLPIDEM TARTRATE 10 MG PO TABS
10.0000 mg | ORAL_TABLET | Freq: Every evening | ORAL | 2 refills | Status: DC | PRN
Start: 2016-09-19 — End: 2017-05-19

## 2016-09-19 NOTE — Patient Instructions (Signed)

## 2016-09-19 NOTE — Progress Notes (Signed)
BP 119/76   Pulse 78   Temp 97.6 F (36.4 C) (Oral)   Ht 5\' 4"  (1.626 m)   Wt 160 lb (72.6 kg)   BMI 27.46 kg/m    Subjective:    Patient ID: Dominique Sandoval, female    DOB: Feb 18, 1966, 51 y.o.   MRN: [id number]  HPI: Dominique Sandoval is a 51 y.o. female presenting on 09/19/2016 for Back and leg pain; Labs Only; and Constipation  This patient comes in for  Abdominal pain and back pain. It has improved significantly in the last few days. She did go see a Restaurant manager, fast food. She also had started the Lexapro that her gynecologist had given her. She is taking 5 mg of Lexapro at this time. She is tolerating it well. They have discussed her need for treatment for anxiety and that it may also help some of her perimenopausal symptoms..   All medications are reviewed today. There are no reports of any problems with the medications. All of the medical conditions are reviewed and updated.  Lab work is reviewed and will be ordered as medically necessary. There are no new problems reported with today's visit.   Relevant past medical, surgical, family and social history reviewed and updated as indicated. Allergies and medications reviewed and updated.  Past Medical History:  Diagnosis Date  . Carpal tunnel syndrome of right wrist   . Mass of right wrist   . Wears contact lenses     Past Surgical History:  Procedure Laterality Date  . CARPAL TUNNEL RELEASE Right 02/02/2014   Procedure: RIGHT CARPAL TUNNEL RELEASE;  Surgeon: Linna Hoff, MD;  Location: [facility];  Service: Orthopedics;  Laterality: Right;  . CESAREAN SECTION  may 2000  . LAPAROSCOPIC ASSISTED VAGINAL HYSTERECTOMY  04-07-2002   w/  left salpingectomy  . MASS EXCISION Right 02/02/2014   Procedure: RIGHT WRIST DEEP  MASS EXCISION ;  Surgeon: Linna Hoff, MD;  [facility];  Service: Orthopedics;  Laterality: Right;  . TONSILLECTOMY  age 73    Review of Systems  Constitutional: Negative.   Negative for activity change, fatigue and fever.  HENT: Negative.   Eyes: Negative.   Respiratory: Negative.  Negative for cough.   Cardiovascular: Negative.  Negative for chest pain.  Gastrointestinal: Positive for abdominal pain.  Endocrine: Negative.   Genitourinary: Negative.  Negative for dysuria.  Musculoskeletal: Positive for back pain.  Skin: Negative.   Neurological: Negative.   Psychiatric/Behavioral: Negative for agitation, decreased concentration and dysphoric mood. The patient is nervous/anxious.     Allergies as of 09/19/2016      Reactions   Ciprofloxacin Hives   Robitussin (alcohol Free) [guaifenesin] Hives      Medication List       Accurate as of 09/19/16 12:51 PM. Always use your most recent med list.          betamethasone dipropionate 0.05 % cream Commonly known as:  DIPROLENE Apply topically 2 (two) times daily.   escitalopram 10 MG tablet Commonly known as:  LEXAPRO   hydrocortisone 2.5 % cream Apply topically 2 (two) times daily.   neomycin-colistin-hydrocortisone-thonzonium 3.08-30-08-0.5 MG/ML otic suspension Commonly known as:  CORTISPORIN-TC Place 3 drops into the left ear 4 (four) times daily.   zolpidem 10 MG tablet Commonly known as:  AMBIEN Take 1 tablet (10 mg total) by mouth at bedtime as needed for sleep.          Objective:  BP 119/76   Pulse 78   Temp 97.6 F (36.4 C) (Oral)   Ht 5\' 4"  (1.626 m)   Wt 160 lb (72.6 kg)   BMI 27.46 kg/m   Allergies  Allergen Reactions  . Ciprofloxacin Hives  . Robitussin (Alcohol Free) [Guaifenesin] Hives    Physical Exam  Constitutional: She is oriented to person, place, and time. She appears well-developed and well-nourished.  HENT:  Head: Normocephalic and atraumatic.  Eyes: Conjunctivae and EOM are normal. Pupils are equal, round, and reactive to light.  Cardiovascular: Normal rate, regular rhythm, normal heart sounds and intact distal pulses.   Pulmonary/Chest: Effort normal  and breath sounds normal.  Abdominal: Soft. Bowel sounds are normal.  Neurological: She is alert and oriented to person, place, and time. She has normal reflexes.  Skin: Skin is warm and dry. No rash noted.  Psychiatric: She has a normal mood and affect. Her behavior is normal. Judgment and thought content normal.  Nursing note and vitals reviewed.   Results for orders placed or performed in visit on 09/03/16  Urine culture  Result Value Ref Range   Urine Culture, Routine Final report    Urine Culture result 1 No growth   Urinalysis  Result Value Ref Range   Specific Gravity, UA 1.015 1.005 - 1.030   pH, UA 6.0 5.0 - 7.5   Color, UA Yellow Yellow   Appearance Ur Clear Clear   Leukocytes, UA Negative Negative   Protein, UA Negative Negative/Trace   Glucose, UA Negative Negative   Ketones, UA Negative Negative   RBC, UA 2+ (A) Negative   Bilirubin, UA Negative Negative   Urobilinogen, Ur 0.2 0.2 - 1.0 mg/dL   Nitrite, UA Negative Negative      Assessment & Plan:   1. Primary insomnia - zolpidem (AMBIEN) 10 MG tablet; Take 1 tablet (10 mg total) by mouth at bedtime as needed for sleep.  Dispense: 30 tablet; Refill: 2  2. Anxiety - escitalopram (LEXAPRO) 10 MG tablet; ; Refill: 3  3. Constipation, unspecified constipation type   Continue all other maintenance medications as listed above.  Follow up plan: Return if symptoms worsen or fail to improve.  Educational handout given for anxiety  Terald Sleeper PA-C [facility] [address]  [place], [place] [address] [phone]   09/19/2016, 12:51 PM

## 2016-10-01 ENCOUNTER — Telehealth: Payer: Self-pay | Admitting: Physician Assistant

## 2016-10-01 DIAGNOSIS — Z0000 Encounter for general adult medical examination without abnormal findings: Secondary | ICD-10-CM

## 2016-10-01 NOTE — Telephone Encounter (Signed)
Patient wanting to know if labs can be put in for a check up and then her come in to see you to review labs. Patient aware you will be out until next week.

## 2016-10-02 NOTE — Telephone Encounter (Signed)
Patient aware.

## 2016-10-06 ENCOUNTER — Other Ambulatory Visit: Payer: BLUE CROSS/BLUE SHIELD

## 2016-10-06 DIAGNOSIS — Z0000 Encounter for general adult medical examination without abnormal findings: Secondary | ICD-10-CM

## 2016-10-07 LAB — CBC WITH DIFFERENTIAL/PLATELET
Basophils Absolute: 0 10*3/uL (ref 0.0–0.2)
Basos: 0 %
EOS (ABSOLUTE): 0.2 10*3/uL (ref 0.0–0.4)
EOS: 2 %
Hematocrit: 39.1 % (ref 34.0–46.6)
Hemoglobin: 13.5 g/dL (ref 11.1–15.9)
IMMATURE GRANULOCYTES: 0 %
Immature Grans (Abs): 0 10*3/uL (ref 0.0–0.1)
Lymphocytes Absolute: 3.5 10*3/uL — ABNORMAL HIGH (ref 0.7–3.1)
Lymphs: 41 %
MCH: 30.6 pg (ref 26.6–33.0)
MCHC: 34.5 g/dL (ref 31.5–35.7)
MCV: 89 fL (ref 79–97)
Monocytes Absolute: 0.4 10*3/uL (ref 0.1–0.9)
Monocytes: 4 %
NEUTROS PCT: 53 %
Neutrophils Absolute: 4.6 10*3/uL (ref 1.4–7.0)
PLATELETS: 253 10*3/uL (ref 150–379)
RBC: 4.41 x10E6/uL (ref 3.77–5.28)
RDW: 13.4 % (ref 12.3–15.4)
WBC: 8.7 10*3/uL (ref 3.4–10.8)

## 2016-10-07 LAB — CMP14+EGFR
ALT: 14 IU/L (ref 0–32)
AST: 17 IU/L (ref 0–40)
Albumin/Globulin Ratio: 1.8 (ref 1.2–2.2)
Albumin: 4.3 g/dL (ref 3.5–5.5)
Alkaline Phosphatase: 73 IU/L (ref 39–117)
BUN/Creatinine Ratio: 24 — ABNORMAL HIGH (ref 9–23)
BUN: 14 mg/dL (ref 6–24)
Bilirubin Total: 0.3 mg/dL (ref 0.0–1.2)
CO2: 26 mmol/L (ref 18–29)
CREATININE: 0.58 mg/dL (ref 0.57–1.00)
Calcium: 9.7 mg/dL (ref 8.7–10.2)
Chloride: 105 mmol/L (ref 96–106)
GFR calc Af Amer: 125 mL/min/{1.73_m2} (ref >59)
GFR, EST NON AFRICAN AMERICAN: 109 mL/min/{1.73_m2} (ref >59)
GLUCOSE: 93 mg/dL (ref 65–99)
Globulin, Total: 2.4 g/dL (ref 1.5–4.5)
Potassium: 4.5 mmol/L (ref 3.5–5.2)
Sodium: 143 mmol/L (ref 134–144)
Total Protein: 6.7 g/dL (ref 6.0–8.5)

## 2016-10-07 LAB — LIPID PANEL
CHOL/HDL RATIO: 2.2 ratio (ref 0.0–4.4)
Cholesterol, Total: 138 mg/dL (ref 100–199)
HDL: 64 mg/dL (ref >39)
LDL CALC: 63 mg/dL (ref 0–99)
Triglycerides: 54 mg/dL (ref 0–149)
VLDL Cholesterol Cal: 11 mg/dL (ref 5–40)

## 2016-10-07 LAB — TSH: TSH: 2.09 u[IU]/mL (ref 0.450–4.500)

## 2016-11-11 ENCOUNTER — Encounter: Payer: Self-pay | Admitting: Family

## 2016-11-11 ENCOUNTER — Ambulatory Visit (INDEPENDENT_AMBULATORY_CARE_PROVIDER_SITE_OTHER): Payer: BLUE CROSS/BLUE SHIELD | Admitting: Family

## 2016-11-11 VITALS — BP 114/79 | HR 76 | Temp 98.1°F | Ht 64.0 in | Wt 158.0 lb

## 2016-11-11 DIAGNOSIS — L247 Irritant contact dermatitis due to plants, except food: Secondary | ICD-10-CM | POA: Diagnosis not present

## 2016-11-11 MED ORDER — METHYLPREDNISOLONE ACETATE 80 MG/ML IJ SUSP
80.0000 mg | Freq: Once | INTRAMUSCULAR | Status: AC
Start: 2016-11-11 — End: 2016-11-11
  Administered 2016-11-11: 80 mg via INTRAMUSCULAR

## 2016-11-11 NOTE — Progress Notes (Signed)
   Subjective:    Patient ID: Dominique Sandoval, female    DOB: 17-Sep-1965, 51 y.o.   MRN: [id number]  Rash  This is a new problem. The current episode started yesterday. The problem has been gradually improving since onset. The affected locations include the head, left upper leg, left lower leg, right lower leg, right upper leg, right arm and left arm. The rash is characterized by dryness, itchiness and redness. She was exposed to plant contact. Past treatments include anti-itch cream and topical steroids. The treatment provided mild relief.      Review of Systems  Skin: Positive for rash.  All other systems reviewed and are negative.      Objective:   Physical Exam  Constitutional: She is oriented to person, place, and time. She appears well-developed and well-nourished. No distress.  HENT:  Head: Normocephalic.  Cardiovascular: Normal rate, regular rhythm, normal heart sounds and intact distal pulses.   No murmur heard. Pulmonary/Chest: Effort normal and breath sounds normal. No respiratory distress. She has no wheezes.  Musculoskeletal: Normal range of motion. She exhibits no edema or tenderness.  Neurological: She is alert and oriented to person, place, and time.  Skin: Skin is warm and dry. Rash noted.  Erythemas rash on bilateral hands, knees, and lower legs.   Psychiatric: She has a normal mood and affect. Her behavior is normal. Judgment and thought content normal.  Vitals reviewed.     BP 114/79   Pulse 76   Temp 98.1 F (36.7 C) (Oral)   Ht 5\' 4"  (1.626 m)   Wt 158 lb (71.7 kg)   BMI 27.12 kg/m      Assessment & Plan:  1. 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   Evelina Dun, FNP

## 2016-11-11 NOTE — Patient Instructions (Signed)
Poison Oak Dermatitis Poison oak dermatitis is inflammation of the skin that is caused by contact with the allergens on the leaves of the poison oak (toxicodendron) plant. The skin reaction often includes redness, swelling, blisters, and extreme itching. What are the causes? This condition is caused by a specific chemical (urushiol) that is found in the sap of the poison oak plant. This chemical is sticky and it can be easily spread to people, animals, and objects. You can get poison oak dermatitis by:  Having direct contact with a poison oak plant.  Touching animals, other people, or objects that have come in contact with poison oak and have the chemical on them. What increases the risk? This condition is more likely to develop in people who:  Are outdoors often.  Go outdoors without wearing protective clothing, such as closed shoes, long pants, and a long-sleeved shirt. What are the signs or symptoms? Symptoms of this condition include:  Redness of the skin.  A rash that may develop blisters.  Extreme itching.  Swelling. This may occur if the reaction is more severe. Symptoms usually last for 1-2 weeks. However, the first time you develop this condition, symptoms may last 3-4 weeks. How is this diagnosed? This condition may be diagnosed based on your symptoms and a physical exam. Your health care provider may also ask you about any recent outdoor activity. How is this treated? Treatment for this condition will vary depending on how severe it is. Treatment may include:  Hydrocortisone creams or calamine lotions to relieve itching.  Oatmeal baths to soothe the skin.  Over-the-counter antihistamine tablets.  Oral steroid medicine for more severe outbreaks. Follow these instructions at home:  Take or apply over-the-counter and prescription medicines only as told by your health care provider.  Wash exposed skin as soon as possible with soap and cold water.  Use hydrocortisone  creams or calamine lotion as needed to soothe the skin and relieve itching.  Take oatmeal baths as needed. Use colloidal oatmeal. You can get this at your local pharmacy or grocery store. Follow the instructions on the packaging.  Do not scratch or rub your skin.  While you have the rash, wash clothes right after you wear them. How is this prevented?  Learn to identify the poison oak plant and avoid contact with the plant. This plant can be recognized by the number of leaves. Generally, poison oak has three leaves with flowering branches on a single stem. The leaves are often a bit fuzzy and have a toothlike edge.  If you have been exposed to poison oak, thoroughly wash with soap and water right away. You have about 30 minutes to remove the plant resin before it will cause the rash. Be sure to wash under your fingernails because any plant resin there will continue to spread the rash.  When hiking or camping, wear clothes that will help you avoid exposure on the skin. This includes long pants, a long-sleeved shirt, tall socks, and hiking boots. You can also apply preventive lotion to your skin to help limit exposure.  If you suspect that your clothes or outdoor gear came in contact with poison oak, rinse them off outside with a garden hose before bringing them inside your house. Contact a health care provider if:  You have open sores in the rash area.  You have more redness, swelling, or pain in the affected area.  You have redness that spreads beyond the rash area.  You have fluid, blood, or pus   coming from the affected area.  You have a fever.  You have a rash over a large area of your body.  You have a rash on your eyes, mouth, or genitals.  Your rash does not improve after a few days. Get help right away if:  Your face swells or your eyes swell shut.  You have trouble breathing.  You have trouble swallowing. This information is not intended to replace advice given to you by  your health care provider. Make sure you discuss any questions you have with your health care provider. Document Released: 12/21/2002 Document Revised: 11/22/2015 Document Reviewed: 11/22/2014 Elsevier Interactive Patient Education  2017 [employer]  

## 2017-03-27 ENCOUNTER — Ambulatory Visit: Payer: BLUE CROSS/BLUE SHIELD | Admitting: Physician Assistant

## 2017-03-28 ENCOUNTER — Ambulatory Visit (INDEPENDENT_AMBULATORY_CARE_PROVIDER_SITE_OTHER): Payer: BLUE CROSS/BLUE SHIELD | Admitting: Physician Assistant

## 2017-03-28 ENCOUNTER — Encounter: Payer: Self-pay | Admitting: Physician Assistant

## 2017-03-28 VITALS — BP 95/65 | HR 66 | Temp 97.3°F | Ht 64.0 in | Wt 157.0 lb

## 2017-03-28 DIAGNOSIS — K5900 Constipation, unspecified: Secondary | ICD-10-CM | POA: Diagnosis not present

## 2017-03-28 DIAGNOSIS — K219 Gastro-esophageal reflux disease without esophagitis: Secondary | ICD-10-CM

## 2017-03-28 MED ORDER — OMEPRAZOLE 20 MG PO CPDR: 20.0000 mg | DELAYED_RELEASE_CAPSULE | Freq: Every day | ORAL | 3 refills | Status: DC

## 2017-03-28 NOTE — Progress Notes (Signed)
BP 95/65   Pulse 66   Temp (!) 97.3 F (36.3 C) (Oral)   Ht 5\' 4"  (1.626 m)   Wt 157 lb (71.2 kg)   BMI 26.95 kg/m    Subjective:    Patient ID: Dominique Sandoval, female    DOB: October 01, 1965, 51 y.o.   MRN: [id number]  HPI: Dominique Sandoval is a 51 y.o. female presenting on 03/28/2017 for Abdominal Pain (all over abd pain, BM's are not regular, off & on for 3 mos, worse recently)  This patient comes in for recheck on abdominal pain. She has had significant increasing constipation. She normally would have bowel movements 1-2 per day. They were soft and formed. At this point she is having very hard stools. She has even seen some blood at times from hemorrhoids. She denies any color change, mucus. She's been using some probiotics to try to get some relief. She is also having significant bloating throughout her abdomen. When she does have a bowel movement she does have relief.  Relevant past medical, surgical, family and social history reviewed and updated as indicated. Allergies and medications reviewed and updated.  Past Medical History:  Diagnosis Date  . Carpal tunnel syndrome of right wrist   . Mass of right wrist   . Wears contact lenses     Past Surgical History:  Procedure Laterality Date  . CARPAL TUNNEL RELEASE Right 02/02/2014   Procedure: RIGHT CARPAL TUNNEL RELEASE;  Surgeon: Linna Hoff, MD;  Location: [facility];  Service: Orthopedics;  Laterality: Right;  . CESAREAN SECTION  may 2000  . LAPAROSCOPIC ASSISTED VAGINAL HYSTERECTOMY  04-07-2002   w/  left salpingectomy  . MASS EXCISION Right 02/02/2014   Procedure: RIGHT WRIST DEEP  MASS EXCISION ;  Surgeon: Linna Hoff, MD;  Location: [facility];  Service: Orthopedics;  Laterality: Right;  . TONSILLECTOMY  age 6    Review of Systems  Constitutional: Negative.   HENT: Negative.   Eyes: Negative.   Respiratory: Negative.   Gastrointestinal: Positive for abdominal distention, abdominal  pain and constipation. Negative for vomiting.  Genitourinary: Negative.     Allergies as of 03/28/2017      Reactions   Ciprofloxacin Hives   Robitussin (alcohol Free) [guaifenesin] Hives      Medication List       Accurate as of 03/28/17 10:55 AM. Always use your most recent med list.          ALPRAZolam 0.25 MG tablet Commonly known as:  XANAX   betamethasone dipropionate 0.05 % cream Commonly known as:  DIPROLENE Apply topically 2 (two) times daily.   escitalopram 10 MG tablet Commonly known as:  LEXAPRO   estradiol 2 MG tablet Commonly known as:  ESTRACE   Fish Oil 1000 MG Caps Take 1 capsule by mouth daily.   hydrocortisone 2.5 % cream Apply topically 2 (two) times daily.   neomycin-colistin-hydrocortisone-thonzonium 3.08-30-08-0.5 MG/ML OTIC suspension Commonly known as:  CORTISPORIN-TC Place 3 drops into the left ear 4 (four) times daily.   omeprazole 20 MG capsule Commonly known as:  PRILOSEC Take 1 capsule (20 mg total) by mouth daily.   zolpidem 10 MG tablet Commonly known as:  AMBIEN Take 1 tablet (10 mg total) by mouth at bedtime as needed for sleep.            Discharge Care Instructions        Start     Ordered   03/28/17  0000  omeprazole (PRILOSEC) 20 MG capsule  Daily    Question:  Supervising Provider  Answer:  Timmothy Euler   03/28/17 1030         Objective:    BP 95/65   Pulse 66   Temp (!) 97.3 F (36.3 C) (Oral)   Ht 5\' 4"  (1.626 m)   Wt 157 lb (71.2 kg)   BMI 26.95 kg/m   Allergies  Allergen Reactions  . Ciprofloxacin Hives  . Robitussin (Alcohol Free) [Guaifenesin] Hives    Physical Exam  Constitutional: She is oriented to person, place, and time. She appears well-developed and well-nourished.  HENT:  Head: Normocephalic and atraumatic.  Right Ear: Tympanic membrane, external ear and ear canal normal.  Left Ear: Tympanic membrane, external ear and ear canal normal.  Nose: Nose normal. No rhinorrhea.    Mouth/Throat: Oropharynx is clear and moist and mucous membranes are normal. No oropharyngeal exudate or posterior oropharyngeal erythema.  Eyes: Pupils are equal, round, and reactive to light. Conjunctivae and EOM are normal.  Neck: Normal range of motion. Neck supple.  Cardiovascular: Normal rate, regular rhythm, normal heart sounds and intact distal pulses.   Pulmonary/Chest: Effort normal and breath sounds normal.  Abdominal: Soft. Bowel sounds are increased. There is no tenderness. There is no CVA tenderness.  Neurological: She is alert and oriented to person, place, and time. She has normal reflexes.  Skin: Skin is warm and dry. No rash noted.  Psychiatric: She has a normal mood and affect. Her behavior is normal. Judgment and thought content normal.        Assessment & Plan:   1. Constipation, unspecified constipation type  2. Gastroesophageal reflux disease without esophagitis - omeprazole (PRILOSEC) 20 MG capsule; Take 1 capsule (20 mg total) by mouth daily.  Dispense: 30 capsule; Refill: 3 Consider H. pylori testing in the future.   Current Outpatient Prescriptions:  .  estradiol (ESTRACE) 2 MG tablet, , Disp: , Rfl: 1 .  Omega-3 Fatty Acids (FISH OIL) 1000 MG CAPS, Take 1 capsule by mouth daily., Disp: , Rfl:  .  ALPRAZolam (XANAX) 0.25 MG tablet, , Disp: , Rfl: 0 .  betamethasone dipropionate (DIPROLENE) 0.05 % cream, Apply topically 2 (two) times daily. (Patient not taking: Reported on 03/28/2017), Disp: 30 g, Rfl: 5 .  escitalopram (LEXAPRO) 10 MG tablet, , Disp: , Rfl: 3 .  hydrocortisone 2.5 % cream, Apply topically 2 (two) times daily. (Patient not taking: Reported on 03/28/2017), Disp: 60 g, Rfl: 2 .  neomycin-colistin-hydrocortisone-thonzonium (CORTISPORIN-TC) 3.08-30-08-0.5 MG/ML otic suspension, Place 3 drops into the left ear 4 (four) times daily. (Patient not taking: Reported on 03/28/2017), Disp: 10 mL, Rfl: 2 .  omeprazole (PRILOSEC) 20 MG capsule, Take 1 capsule  (20 mg total) by mouth daily., Disp: 30 capsule, Rfl: 3 .  zolpidem (AMBIEN) 10 MG tablet, Take 1 tablet (10 mg total) by mouth at bedtime as needed for sleep. (Patient not taking: Reported on 03/28/2017), Disp: 30 tablet, Rfl: 2 Continue all other maintenance medications as listed above.  Follow up plan: Return in about 4 weeks (around 04/25/2017) for recheck.  Educational handout given for [place] PA-C [facility] [address]  [place], [place] [address] [phone]   03/28/2017, 10:55 AM

## 2017-03-28 NOTE — Patient Instructions (Signed)
In a few days you may receive a survey in the mail or online from [employer] regarding your visit with us today. Please take a moment to fill this out. Your feedback is very important to our whole office. It can help us better understand your needs as well as improve your experience and satisfaction. Thank you for taking your time to complete it. We care about you.  Jauna Raczynski, PA-C  

## 2017-05-19 ENCOUNTER — Ambulatory Visit: Payer: BLUE CROSS/BLUE SHIELD | Admitting: Family Medicine

## 2017-05-19 ENCOUNTER — Encounter: Payer: Self-pay | Admitting: Family Medicine

## 2017-05-19 VITALS — BP 118/72 | HR 79 | Temp 96.9°F | Ht 64.0 in | Wt 160.0 lb

## 2017-05-19 DIAGNOSIS — R109 Unspecified abdominal pain: Secondary | ICD-10-CM

## 2017-05-19 DIAGNOSIS — Z1211 Encounter for screening for malignant neoplasm of colon: Secondary | ICD-10-CM

## 2017-05-19 DIAGNOSIS — R1084 Generalized abdominal pain: Secondary | ICD-10-CM | POA: Diagnosis not present

## 2017-05-19 LAB — URINALYSIS
BILIRUBIN UA: NEGATIVE
GLUCOSE, UA: NEGATIVE
Ketones, UA: NEGATIVE
Leukocytes, UA: NEGATIVE
Nitrite, UA: NEGATIVE
PROTEIN UA: NEGATIVE
Specific Gravity, UA: 1.02 (ref 1.005–1.030)
Urobilinogen, Ur: 0.2 mg/dL (ref 0.2–1.0)
pH, UA: 5.5 (ref 5.0–7.5)

## 2017-05-19 NOTE — Progress Notes (Signed)
Subjective:  Patient ID: Dominique Sandoval, female    DOB: 1966/03/14  Age: 51 y.o. MRN: [id number]  CC: Constipation (pt here today c/o constipation and "stomach issues")   HPI Dominique Sandoval presents for Onset about a year ago of mid abdominal pain.  She reports that along with the discomfort she feels bloated and intermittently has large amounts of gas and gets constipated.  With maximal laxatives she will have a bowel movement daily but sometimes can go a whole week without a bowel movement.  She does not have diarrhea.  She had a colonoscopy approximately 10 years ago that showed 3 polyps.  Her next colonoscopy is due in about 3 months.  She has problems with heartburn as well but she discontinued her omeprazole even though she says she thinks it was helping.  She also discontinued her Lexapro approximately 3 months ago thinking that it was contributing to the discomfort.  She was concerned that it was making the constipation and gas worse.  She has historically had problems with insomnia and anxiety she is currently not taking those medications either.  Depression screen [facility] 2/9 05/19/2017 11/11/2016 09/03/2016  Decreased Interest 0 0 0  Down, Depressed, Hopeless 0 0 0  PHQ - 2 Score 0 0 0    History Dominique Sandoval has a past medical history of Carpal tunnel syndrome of right wrist, Mass of right wrist, and Wears contact lenses.   She has a past surgical history that includes Laparoscopic assisted vaginal hysterectomy (04-07-2002); Tonsillectomy (age 66); Cesarean section (may 2000); Mass excision (Right, 02/02/2014); and Carpal tunnel release (Right, 02/02/2014).   Her family history is not on file.She reports that she quit smoking about 13 years ago. Her smoking use included cigarettes. She has a 3.75 pack-year smoking history. she has never used smokeless tobacco. She reports that she drinks alcohol. She reports that she does not use drugs.    ROS Review of Systems  Constitutional: Negative for  activity change, appetite change and fever.  HENT: Negative for congestion, rhinorrhea and sore throat.   Eyes: Negative for visual disturbance.  Respiratory: Negative for cough and shortness of breath.   Cardiovascular: Negative for chest pain and palpitations.  Gastrointestinal: Positive for abdominal distention, abdominal pain and constipation. Negative for anal bleeding, blood in stool, diarrhea and nausea.  Genitourinary: Positive for hematuria (chronic - unchanged for years). Negative for dysuria, flank pain and frequency.  Musculoskeletal: Negative for arthralgias and myalgias.  Psychiatric/Behavioral: The patient is nervous/anxious.     Objective:  BP 118/72   Pulse 79   Temp (!) 96.9 F (36.1 C) (Oral)   Ht 5\' 4"  (1.626 m)   Wt 160 lb (72.6 kg)   BMI 27.46 kg/m    BP Readings from Last 3 Encounters:  05/19/17 118/72  03/28/17 95/65  11/11/16 114/79    Wt Readings from Last 3 Encounters:  05/19/17 160 lb (72.6 kg)  03/28/17 157 lb (71.2 kg)  11/11/16 158 lb (71.7 kg)     Physical Exam  Constitutional: She is oriented to person, place, and time. She appears well-developed and well-nourished. No distress.  HENT:  Head: Normocephalic and atraumatic.  Right Ear: External ear normal.  Left Ear: External ear normal.  Nose: Nose normal.  Mouth/Throat: Oropharynx is clear and moist.  Eyes: Conjunctivae and EOM are normal. Pupils are equal, round, and reactive to light.  Neck: Normal range of motion. Neck supple. No thyromegaly present.  Cardiovascular: Normal rate, regular rhythm and  normal heart sounds.  No murmur heard. Pulmonary/Chest: Effort normal and breath sounds normal. No respiratory distress. She has no wheezes. She has no rales.  Abdominal: Soft. Bowel sounds are normal. She exhibits no distension and no mass. There is no tenderness. There is no rebound and no guarding.  Lymphadenopathy:    She has no cervical adenopathy.  Neurological: She is alert and  oriented to person, place, and time. She has normal reflexes.  Skin: Skin is warm and dry.  Psychiatric: She has a normal mood and affect. Her behavior is normal. Judgment and thought content normal.      Assessment & Plan:   Dominique Sandoval was seen today for constipation.  Diagnoses and all orders for this visit:  Generalized abdominal pain -     Urine Culture -     Urinalysis  Flank pain -     Urine Culture -     Urinalysis  Screening for colon cancer -     Ambulatory referral to Gastroenterology       I have discontinued Dominique Sandoval's neomycin-colistin-hydrocortisone-thonzonium, betamethasone dipropionate, escitalopram, zolpidem, estradiol, Fish Oil, and omeprazole. I am also having her maintain her hydrocortisone and ALPRAZolam.  Allergies as of 05/19/2017      Reactions   Ciprofloxacin Hives   Robitussin (alcohol Free) [guaifenesin] Hives      Medication List        Accurate as of 05/19/17 10:57 PM. Always use your most recent med list.          ALPRAZolam 0.25 MG tablet Commonly known as:  XANAX   hydrocortisone 2.5 % cream Apply topically 2 (two) times daily.     Patient is to go on a 6-week trial of dairy/lactose free diet and gluten-free diet.  We discussed in detail the food groups that were both suspected and those that were saved.  Handouts to help her with these trials were printed for her.   Follow-up: Return in about 6 weeks (around 06/30/2017) for Abdominal pain.  Claretta Fraise, M.D.

## 2017-05-19 NOTE — Patient Instructions (Signed)
Lactose-Free Diet, Adult If you have lactose intolerance, you are not able to digest lactose. Lactose is a natural sugar found mainly in milk and milk products. You may need to avoid all foods and beverages that contain lactose. A lactose-free diet can help you do this. What do I need to know about this diet?  Do not consume foods, beverages, vitamins, minerals, or medicines with lactose. Read ingredients lists carefully.  Look for the words "lactose-free" on labels.  Use lactase enzyme drops or tablets as directed by your health care provider.  Use lactose-free milk or a milk alternative, such as soy milk, for drinking and cooking.  Make sure you get enough calcium and vitamin D in your diet. A lactose-free eating plan can be lacking in these important nutrients.  Take calcium and vitamin D supplements as directed by your health care provider. Talk to your provider about supplements if you are not able to get enough calcium and vitamin D from food. Which foods have lactose? Lactose is found in:  Milk and foods made from milk.  Yogurt.  Cheese.  Butter.  Margarine.  Sour cream.  Cream.  Whipped toppings and nondairy creamers.  Ice cream and other milk-based desserts.  Lactose is also found in foods or products made with milk or milk ingredients. To find out whether a food contains milk or a milk ingredient, look at the ingredients list. Avoid foods with the statement "May contain milk" and foods that contain:  Butter.  Cream.  Milk.  Milk solids.  Milk powder.  Whey.  Curd.  Caseinate.  Lactose.  Lactalbumin.  Lactoglobulin.  What are some alternatives to milk and foods made with milk products?  Lactose-free milk.  Soy milk with added calcium and vitamin D.  Almond, coconut, or rice milk with added calcium and vitamin D. Note that these are low in protein.  Soy products, such as soy yogurt, soy cheese, soy ice cream, and soy-based sour cream. Which  foods can I eat? Grains Breads and rolls made without milk, such as [place], [place], or [place] bread, bagels, pita, and [employer]. Corn tortillas, corn meal, grits, and polenta. Crackers without lactose or milk solids, such as soda crackers and graham crackers. Cooked or dry cereals without lactose or milk solids. Pasta, quinoa, couscous, barley, oats, bulgur, farro, rice, wild rice, or other grains prepared without milk or lactose. Plain popcorn. Vegetables Fresh, frozen, and canned vegetables without cheese, cream, or butter sauces. Fruits All fresh, canned, frozen, or dried fruits that are not processed with lactose. Meats and Other Protein Sources Plain beef, chicken, fish, [place], lamb, veal, pork, wild game, or ham. Kosher-prepared meat products. Strained or junior meats that do not contain milk. Eggs. Soy meat substitutes. Beans, lentils, and hummus. Tofu. Nuts and seeds. Peanut or other nut butters without lactose. Soups, casseroles, and mixed dishes without cheese, cream, or milk. Dairy Lactose-free milk. Soy, rice, or almond milk with added calcium and vitamin D. Soy cheese and yogurt. Beverages Carbonated drinks. Tea. Coffee, freeze-dried coffee, and some instant coffees. Fruit and vegetable juices. Condiments Soy sauce. Carob powder. Olives. Gravy made with water. Baker's cocoa. Angie Fava. Pure seasonings and spices. Ketchup. Mustard. Bouillon. Broth. Sweets and Desserts Water and fruit ices. Gelatin. Cookies, pies, or cakes made from allowed ingredients, such as angel food cake. Pudding made with water or a milk substitute. Lactose-free tofu desserts. Soy, coconut milk, or rice-milk-based frozen desserts. Sugar. Honey. Jam, jelly, and marmalade. Molasses. Pure sugar candy. Dark chocolate without  milk. Marshmallows. Fats and Oils Margarines and salad dressings that do not contain milk. Berniece Salines. Vegetable oils. Shortening. Mayonnaise. Soy or coconut-based cream. The items listed above may not  be a complete list of recommended foods or beverages. Contact your dietitian for more options. Which foods are not recommended? Grains Breads and rolls that contain milk. Toaster pastries. Muffins, biscuits, waffles, cornbread, and pancakes. These can be prepared at home, commercial, or from mixes. Sweet rolls, donuts, English muffins, fry bread, lefse, flour tortillas with lactose, or [place] toast made with milk or milk ingredients. Crackers that contain lactose. Corn curls. Cooked or dry cereals with lactose. Vegetables Creamed or breaded vegetables. Vegetables in a cheese or butter sauce or with lactose-containing margarines. Instant potatoes. [place] fries. Scalloped or au gratin potatoes. Fruits None. Meats and Other Protein Sources Scrambled eggs, omelets, and souffles that contain milk. Creamed or breaded meat, fish, chicken, or [place]. Sausage products, such as wieners and liver sausage. Cold cuts that contain milk solids. Cheese, cottage cheese, ricotta cheese, and cheese spreads. Lasagna and macaroni and cheese. Pizza. Peanut or other nut butters with added milk solids. Casseroles or mixed dishes containing milk or cheese. Dairy All dairy products, including milk, goat's milk, buttermilk, kefir, acidophilus milk, flavored milk, evaporated milk, condensed milk, dulce de Vici, eggnog, yogurt, cheese, and cheese spreads. Beverages Hot chocolate. Cocoa with lactose. Instant iced teas. Powdered fruit drinks. Smoothies made with milk or yogurt. Condiments Chewing gum that has lactose. Cocoa that has lactose. Spice blends if they contain milk products. Artificial sweeteners that contain lactose. Nondairy creamers. Sweets and Desserts Ice cream, ice milk, gelato, sherbet, and frozen yogurt. Custard, pudding, and mousse. Cake, cream pies, cookies, and other desserts containing milk, cream, cream cheese, or milk chocolate. Pie crust made with milk-containing margarine or butter. Reduced-calorie  desserts made with a sugar substitute that contains lactose. Toffee and butterscotch. Milk, white, or dark chocolate that contains milk. Fudge. Caramel. Fats and Oils Margarines and salad dressings that contain milk or cheese. Cream. Half and half. Cream cheese. Sour cream. Chip dips made with sour cream or yogurt. The items listed above may not be a complete list of foods and beverages to avoid. Contact your dietitian for more information. Am I getting enough calcium? Calcium is found in many foods that contain lactose and is important for bone health. The amount of calcium you need depends on your age:  Adults younger than 50 years: 1000 mg of calcium a day.  Adults older than 50 years: 1200 mg of calcium a day.  If you are not getting enough calcium, other calcium sources include:  Orange juice with calcium added. There are 300-350 mg of calcium in 1 cup of orange juice.  Sardines with edible bones. There are 325 mg of calcium in 3 oz of sardines.  Calcium-fortified soy milk. There are 300-400 mg of calcium in 1 cup of calcium-fortified soy milk.  Calcium-fortified rice or almond milk. There are 300 mg of calcium in 1 cup of calcium-fortified rice or almond milk.  Canned salmon with edible bones. There are 180 mg of calcium in 3 oz of canned salmon with edible bones.  Calcium-fortified breakfast cereals. There are [phone] mg of calcium in calcium-fortified breakfast cereals.  Tofu set with calcium sulfate. There are 250 mg of calcium in  cup of tofu set with calcium sulfate.  Spinach, cooked. There are 145 mg of calcium in  cup of cooked spinach.  Edamame, cooked. There are 130 mg of  calcium in  cup of cooked edamame.  Collard greens, cooked. There are 125 mg of calcium in  cup of cooked collard greens.  Kale, frozen or cooked. There are 90 mg of calcium in  cup of cooked or frozen kale.  Almonds. There are 95 mg of calcium in  cup of almonds.  Broccoli, cooked. There  are 60 mg of calcium in 1 cup of cooked broccoli.  This information is not intended to replace advice given to you by your health care provider. Make sure you discuss any questions you have with your health care provider. Document Released: 12/06/2001 Document Revised: 11/22/2015 Document Reviewed: 09/16/2013 Elsevier Interactive Patient Education  2018 [employer]. Gluten-Free Diet for Celiac Disease, Adult The gluten-free diet includes all foods that do not contain gluten. Gluten is a protein that is found in wheat, rye, barley, and some other grains. Following the gluten-free diet is the only treatment for people with celiac disease. It helps to prevent damage to the intestines and improves or eliminates the symptoms of celiac disease. Following the gluten-free diet requires some planning. It can be challenging at first, but it gets easier with time and practice. There are more gluten-free options available today than ever before. If you need help finding gluten-free foods or if you have questions, talk with your diet and nutrition specialist (registered dietitian) or your health care provider. What do I need to know about a gluten-free diet?  All fruits, vegetables, and meats are safe to eat and do not contain gluten.  When grocery shopping, start by shopping in the produce, meat, and dairy sections. These sections are more likely to contain gluten-free foods. Then move to the aisles that contain packaged foods if you need to.  Read all food labels. Gluten is often added to foods. Always check the ingredient list and look for warnings, such as "may contain gluten."  Talk with your dietitian or health care provider before taking a gluten-free multivitamin or mineral supplement.  Be aware of gluten-free foods having contact with foods that contain gluten (cross-contamination). This can happen at home and with any processed foods. ? Talk with your health care provider or dietitian about how to  reduce the risk of cross-contamination in your home. ? If you have questions about how a food is processed, ask the manufacturer. What key words help to identify gluten? Foods that list any of these key words on the label usually contain gluten:  Wheat, flour, enriched flour, bromated flour, white flour, durum flour, graham flour, phosphated flour, self-rising flour, semolina, farina, barley (malt), rye, and oats.  Starch, dextrin, modified food starch, or cereal.  Thickening, fillers, or emulsifiers.  Malt flavoring, malt extract, or malt syrup.  Hydrolyzed vegetable protein.  In the [place] packaged foods that are gluten-free are required to be labeled "GF." These foods should be easy to identify and are safe to eat. In the [place] food companies are also required to list common food allergens, including wheat, on their labels. Recommended foods Grains  Amaranth, bean flours, 100% buckwheat flour, corn, millet, nut flours or nut meals, GF oats, quinoa, rice, sorghum, teff, rice wafers, pure cornmeal tortillas, popcorn, and hot cereals made from cornmeal. Hominy, rice, wild rice. Some Asian rice noodles or bean noodles. Arrowroot starch, corn bran, corn flour, corn germ, cornmeal, corn starch, potato flour, potato starch flour, and rice bran. Plain, brown, and sweet rice flours. Rice polish, soy flour, and tapioca starch. Vegetables  All plain fresh, frozen, and  canned vegetables. Fruits  All plain fresh, frozen, canned, and dried fruits, and 100% fruit juices. Meats and other protein foods  All fresh beef, pork, poultry, fish, seafood, and eggs. Fish canned in water, oil, brine, or vegetable broth. Plain nuts and seeds, peanut butter. Some lunch meat and some frankfurters. Dried beans, dried peas, and lentils. Dairy  Fresh plain, dry, evaporated, or condensed milk. Cream, butter, sour cream, whipping cream, and most yogurts. Unprocessed cheese, most processed cheeses, some cottage  cheese, some cream cheeses. Beverages  Coffee, tea, most herbal teas. Carbonated beverages and some root beers. Wine, sake, and distilled spirits, such as gin, vodka, and whiskey. Most hard ciders. Fats and oils  Butter, margarine, vegetable oil, hydrogenated butter, olive oil, shortening, lard, cream, and some mayonnaise. Some commercial salad dressings. Olives. Sweets and desserts  Sugar, honey, some syrups, molasses, jelly, and jam. Plain hard candy, marshmallows, and gumdrops. Pure cocoa powder. Plain chocolate. Custard and some pudding mixes. Gelatin desserts, sorbets, frozen ice pops, and sherbet. Cake, cookies, and other desserts prepared with allowed flours. Some commercial ice creams. Cornstarch, tapioca, and rice puddings. Seasoning and other foods  Some canned or frozen soups. Monosodium glutamate (MSG). Cider, rice, and wine vinegar. Baking soda and baking powder. Cream of tartar. Baking and nutritional yeast. Certain soy sauces made without wheat (ask your dietitian about specific brands that are allowed). Nuts, coconut, and chocolate. Salt, pepper, herbs, spices, flavoring extracts, imitation or artificial flavorings, natural flavorings, and food colorings. Some medicines and supplements. Some lip glosses and other cosmetics. Rice syrups. The items listed may not be a complete list. Talk with your dietitian about what dietary choices are best for you. Foods to avoid Grains  Barley, bran, bulgur, couscous, cracked wheat, [place], farro, graham, malt, matzo, semolina, wheat germ, and all wheat and rye cereals including spelt and kamut. Cereals containing malt as a flavoring, such as rice cereal. Noodles, spaghetti, macaroni, most packaged rice mixes, and all mixes containing wheat, rye, barley, or triticale. Vegetables  Most creamed vegetables and most vegetables canned in sauces. Some commercially prepared vegetables and salads. Fruits  Thickened or prepared fruits and some pie  fillings. Some fruit snacks and fruit roll-ups. Meats and other protein foods  Any meat or meat alternative containing wheat, rye, barley, or gluten stabilizers. These are often marinated or packaged meats and lunch meats. Bread-containing products, such as Swiss steak, croquettes, meatballs, and meatloaf. Most tuna canned in vegetable broth and [place] with hydrolyzed vegetable protein (HVP) injected as part of the basting. [place]. Imitation fish. Eggs in sauces made from ingredients to avoid. Dairy  Commercial chocolate milk drinks and malted milk. Some non-dairy creamers. Any cheese product containing ingredients to avoid. Beverages  Certain cereal beverages. Beer, ale, malted milk, and some root beers. Some hard ciders. Some instant flavored coffees. Some herbal teas made with barley or with barley malt added. Fats and oils  Some commercial salad dressings. Sour cream containing modified food starch. Sweets and desserts  Some toffees. Chocolate-coated nuts (may be rolled in wheat flour) and some commercial candies and candy bars. Most cakes, cookies, donuts, pastries, and other baked goods. Some commercial ice cream. Ice cream cones. Commercially prepared mixes for cakes, cookies, and other desserts. Bread pudding and other puddings thickened with flour. Products containing brown rice syrup made with barley malt enzyme. Desserts and sweets made with malt flavoring. Seasoning and other foods  Some curry powders, some dry seasoning mixes, some gravy extracts, some meat sauces, some  ketchups, some prepared mustards, and horseradish. Certain soy sauces. Malt vinegar. Bouillon and bouillon cubes that contain HVP. Some chip dips, and some chewing gum. Yeast extract. Brewer's yeast. Caramel color. Some medicines and supplements. Some lip glosses and other cosmetics. The items listed may not be a complete list. Talk with your dietitian about what dietary choices are best for you. Summary  Gluten is  a protein that is found in wheat, rye, barley, and some other grains. The gluten-free diet includes all foods that do not contain gluten.  If you need help finding gluten-free foods or if you have questions, talk with your diet and nutrition specialist (registered dietitian) or your health care provider.  Read all food labels. Gluten is often added to foods. Always check the ingredient list and look for warnings, such as "may contain gluten." This information is not intended to replace advice given to you by your health care provider. Make sure you discuss any questions you have with your health care provider. Document Released: 06/16/2005 Document Revised: 03/31/2016 Document Reviewed: 03/31/2016 Elsevier Interactive Patient Education  2018 [employer].

## 2017-05-20 LAB — URINE CULTURE: ORGANISM ID, BACTERIA: NO GROWTH

## 2017-07-02 ENCOUNTER — Telehealth: Payer: Self-pay | Admitting: Physician Assistant

## 2017-07-02 ENCOUNTER — Ambulatory Visit: Payer: BLUE CROSS/BLUE SHIELD | Admitting: Physician Assistant

## 2017-07-02 ENCOUNTER — Encounter: Payer: Self-pay | Admitting: Physician Assistant

## 2017-07-02 VITALS — BP 125/82 | HR 68 | Temp 96.8°F | Ht 64.0 in | Wt 158.4 lb

## 2017-07-02 DIAGNOSIS — R51 Headache: Secondary | ICD-10-CM

## 2017-07-02 DIAGNOSIS — M542 Cervicalgia: Secondary | ICD-10-CM

## 2017-07-02 DIAGNOSIS — R519 Headache, unspecified: Secondary | ICD-10-CM

## 2017-07-02 MED ORDER — PREDNISONE 10 MG PO TABS: 10.0000 mg | ORAL_TABLET | Freq: Every day | ORAL | 0 refills | Status: DC

## 2017-07-02 NOTE — Progress Notes (Signed)
BP 125/82   Pulse 68   Temp (!) 96.8 F (36 C) (Oral)   Ht 5\' 4"  (1.626 m)   Wt 158 lb 6.4 oz (71.8 kg)   BMI 27.19 kg/m    Subjective:    Patient ID: Dominique Sandoval, female    DOB: 1966/01/30, 52 y.o.   MRN: [id number]  HPI: Dominique Sandoval is a 52 y.o. female presenting on 07/02/2017 for Abdominal Pain (6 week rck )  Comes in for recheck on her respiratory illnesses.  She had a significant episode of illnesses since the end of November.  She was diagnosed with influenza.  Then she followed up with a severe abdominal pain episode.  She also developed bronchitis after that.  Ever since then she has had very bad neck and face pain.  She had a history of headaches in this area before.  She used to see Dr. Francesco Runner for this problem.  She even has had an injection for this before she is currently taking digestive advantage and we have discussed her trying align as a probiotic product.  Relevant past medical, surgical, family and social history reviewed and updated as indicated. Allergies and medications reviewed and updated.  Past Medical History:  Diagnosis Date  . Carpal tunnel syndrome of right wrist   . Mass of right wrist   . Wears contact lenses     Past Surgical History:  Procedure Laterality Date  . CARPAL TUNNEL RELEASE Right 02/02/2014   Procedure: RIGHT CARPAL TUNNEL RELEASE;  Surgeon: Linna Hoff, MD;  Location: [facility];  Service: Orthopedics;  Laterality: Right;  . CESAREAN SECTION  may 2000  . LAPAROSCOPIC ASSISTED VAGINAL HYSTERECTOMY  04-07-2002   w/  left salpingectomy  . MASS EXCISION Right 02/02/2014   Procedure: RIGHT WRIST DEEP  MASS EXCISION ;  Surgeon: Linna Hoff, MD;  Location: [facility];  Service: Orthopedics;  Laterality: Right;  . TONSILLECTOMY  age 33    Review of Systems  Constitutional: Negative.  Negative for activity change, fatigue and fever.  HENT: Positive for ear pain. Negative for tinnitus, trouble  swallowing and voice change.   Eyes: Negative.   Respiratory: Negative.  Negative for cough.   Cardiovascular: Negative.  Negative for chest pain.  Gastrointestinal: Positive for abdominal distention and abdominal pain.  Endocrine: Negative.   Genitourinary: Negative.  Negative for dysuria.  Musculoskeletal: Positive for neck pain and neck stiffness.  Skin: Negative.   Neurological: Positive for headaches. Negative for dizziness, tremors, seizures, syncope, weakness and numbness.    Allergies as of 07/02/2017      Reactions   Ciprofloxacin Hives   Robitussin (alcohol Free) [guaifenesin] Hives      Medication List        Accurate as of 07/02/17  5:08 PM. Always use your most recent med list.          ALPRAZolam 0.25 MG tablet Commonly known as:  XANAX   hydrocortisone 2.5 % cream Apply topically 2 (two) times daily.   predniSONE 10 MG tablet Commonly known as:  DELTASONE Take 1-2 tablets (10-20 mg total) by mouth daily with breakfast.          Objective:    BP 125/82   Pulse 68   Temp (!) 96.8 F (36 C) (Oral)   Ht 5\' 4"  (1.626 m)   Wt 158 lb 6.4 oz (71.8 kg)   BMI 27.19 kg/m   Allergies  Allergen Reactions  .  Ciprofloxacin Hives  . Robitussin (Alcohol Free) [Guaifenesin] Hives    Physical Exam  Constitutional: She is oriented to person, place, and time. She appears well-developed and well-nourished.  HENT:  Head: Normocephalic and atraumatic.  Right Ear: Tympanic membrane, external ear and ear canal normal.  Left Ear: Tympanic membrane, external ear and ear canal normal.  Nose: Nose normal. No rhinorrhea.  Mouth/Throat: Oropharynx is clear and moist and mucous membranes are normal. No oropharyngeal exudate or posterior oropharyngeal erythema.  Eyes: Conjunctivae and EOM are normal. Pupils are equal, round, and reactive to light.  Neck: Normal range of motion. Neck supple.  Cardiovascular: Normal rate, regular rhythm, normal heart sounds and intact distal  pulses.  Pulmonary/Chest: Effort normal and breath sounds normal.  Abdominal: Soft. Bowel sounds are normal.  Musculoskeletal:       Cervical back: She exhibits tenderness. She exhibits no edema and no deformity.       Back:  Neurological: She is alert and oriented to person, place, and time. She has normal reflexes.  Skin: Skin is warm and dry. No rash noted.  Psychiatric: She has a normal mood and affect. Her behavior is normal. Judgment and thought content normal.        Assessment & Plan:   1. Neck pain - predniSONE (DELTASONE) 10 MG tablet; Take 1-2 tablets (10-20 mg total) by mouth daily with breakfast.  Dispense: 60 tablet; Refill: 0 - Ambulatory referral to Pain Clinic  2. Head and face pain - predniSONE (DELTASONE) 10 MG tablet; Take 1-2 tablets (10-20 mg total) by mouth daily with breakfast.  Dispense: 60 tablet; Refill: 0 - Ambulatory referral to Pain Clinic    Current Outpatient Medications:  .  ALPRAZolam (XANAX) 0.25 MG tablet, , Disp: , Rfl: 0 .  hydrocortisone 2.5 % cream, Apply topically 2 (two) times daily., Disp: 60 g, Rfl: 2 .  predniSONE (DELTASONE) 10 MG tablet, Take 1-2 tablets (10-20 mg total) by mouth daily with breakfast., Disp: 60 tablet, Rfl: 0 Continue all other maintenance medications as listed above.  Follow up plan: No Follow-up on file.  Educational handout given for [place] PA-C [place] [address]  [place], [place] [address] [phone]   07/02/2017, 5:08 PM

## 2017-07-02 NOTE — Patient Instructions (Signed)
Align

## 2017-07-03 NOTE — Telephone Encounter (Signed)
done

## 2017-07-20 DIAGNOSIS — M4802 Spinal stenosis, cervical region: Secondary | ICD-10-CM | POA: Insufficient documentation

## 2017-07-20 DIAGNOSIS — M5030 Other cervical disc degeneration, unspecified cervical region: Secondary | ICD-10-CM | POA: Insufficient documentation

## 2017-07-20 DIAGNOSIS — M47812 Spondylosis without myelopathy or radiculopathy, cervical region: Secondary | ICD-10-CM | POA: Insufficient documentation

## 2017-07-27 ENCOUNTER — Other Ambulatory Visit: Payer: Self-pay | Admitting: Physician Assistant

## 2017-07-27 DIAGNOSIS — K219 Gastro-esophageal reflux disease without esophagitis: Secondary | ICD-10-CM

## 2017-09-08 ENCOUNTER — Ambulatory Visit: Payer: BLUE CROSS/BLUE SHIELD | Admitting: Physician Assistant

## 2017-09-08 ENCOUNTER — Encounter: Payer: Self-pay | Admitting: Physician Assistant

## 2017-09-08 VITALS — BP 107/74 | HR 72 | Temp 97.0°F | Ht 64.0 in | Wt 149.4 lb

## 2017-09-08 DIAGNOSIS — Z0000 Encounter for general adult medical examination without abnormal findings: Secondary | ICD-10-CM | POA: Diagnosis not present

## 2017-09-08 DIAGNOSIS — L219 Seborrheic dermatitis, unspecified: Secondary | ICD-10-CM

## 2017-09-08 MED ORDER — HYDROCORTISONE 2.5 % EX CREA: TOPICAL_CREAM | Freq: Two times a day (BID) | CUTANEOUS | 3 refills | Status: DC

## 2017-09-08 NOTE — Patient Instructions (Signed)
In a few days you may receive a survey in the mail or online from [employer] regarding your visit with us today. Please take a moment to fill this out. Your feedback is very important to our whole office. It can help us better understand your needs as well as improve your experience and satisfaction. Thank you for taking your time to complete it. We care about you.  Elsie Baynes, PA-C  

## 2017-09-09 LAB — CBC WITH DIFFERENTIAL/PLATELET
Basophils Absolute: 0 10*3/uL (ref 0.0–0.2)
Basos: 0 %
EOS (ABSOLUTE): 0.2 10*3/uL (ref 0.0–0.4)
EOS: 2 %
HEMATOCRIT: 41.7 % (ref 34.0–46.6)
Hemoglobin: 13.5 g/dL (ref 11.1–15.9)
IMMATURE GRANS (ABS): 0 10*3/uL (ref 0.0–0.1)
Immature Granulocytes: 0 %
LYMPHS ABS: 2.9 10*3/uL (ref 0.7–3.1)
LYMPHS: 39 %
MCH: 29.9 pg (ref 26.6–33.0)
MCHC: 32.4 g/dL (ref 31.5–35.7)
MCV: 92 fL (ref 79–97)
Monocytes Absolute: 0.4 10*3/uL (ref 0.1–0.9)
Monocytes: 6 %
Neutrophils Absolute: 4 10*3/uL (ref 1.4–7.0)
Neutrophils: 53 %
Platelets: 248 10*3/uL (ref 150–379)
RBC: 4.52 x10E6/uL (ref 3.77–5.28)
RDW: 12.7 % (ref 12.3–15.4)
WBC: 7.5 10*3/uL (ref 3.4–10.8)

## 2017-09-09 LAB — LIPID PANEL
CHOL/HDL RATIO: 2.4 ratio (ref 0.0–4.4)
CHOLESTEROL TOTAL: 142 mg/dL (ref 100–199)
HDL: 58 mg/dL (ref >39)
LDL CALC: 71 mg/dL (ref 0–99)
TRIGLYCERIDES: 67 mg/dL (ref 0–149)
VLDL CHOLESTEROL CAL: 13 mg/dL (ref 5–40)

## 2017-09-09 LAB — TSH: TSH: 1.55 u[IU]/mL (ref 0.450–4.500)

## 2017-09-10 NOTE — Progress Notes (Signed)
BP 107/74   Pulse 72   Temp (!) 97 F (36.1 C) (Oral)   Ht 5\' 4"  (1.626 m)   Wt 149 lb 6.4 oz (67.8 kg)   BMI 25.64 kg/m    Subjective:    Patient ID: Dominique Sandoval, female    DOB: 05/21/1966, 52 y.o.   MRN: [id number]  HPI: Dominique Sandoval is a 52 y.o. female presenting on 09/08/2017 for Annual Exam  This patient comes in for annual well physical examination. All medications are reviewed today. There are no reports of any problems with the medications. All of the medical conditions are reviewed and updated.  Lab work is reviewed and will be ordered as medically necessary. There are no new problems reported with today's visit.  Patient reports doing well overall.  Continuing to see the gastroenterologist for her abdominal pain, bloating. She has possible gluten sensitivity.   Past Medical History:  Diagnosis Date  . Carpal tunnel syndrome of right wrist   . Mass of right wrist   . Wears contact lenses    Relevant past medical, surgical, family and social history reviewed and updated as indicated. Interim medical history since our last visit reviewed. Allergies and medications reviewed and updated. DATA REVIEWED: CHART IN EPIC  Family History reviewed for pertinent findings.  Review of Systems  Constitutional: Negative.  Negative for activity change, fatigue and fever.  HENT: Negative.   Eyes: Negative.   Respiratory: Negative.  Negative for cough.   Cardiovascular: Negative.  Negative for chest pain.  Gastrointestinal: Positive for abdominal distention, abdominal pain and nausea.  Endocrine: Negative.   Genitourinary: Negative.  Negative for dysuria.  Musculoskeletal: Negative.   Skin: Negative.   Neurological: Negative.     Allergies as of 09/08/2017      Reactions   Ciprofloxacin Hives   Robitussin (alcohol Free) [guaifenesin] Hives      Medication List        Accurate as of 09/08/17 11:59 PM. Always use your most recent med list.          ALPRAZolam  0.25 MG tablet Commonly known as:  XANAX   hydrocortisone 2.5 % cream Apply topically 2 (two) times daily.   pantoprazole 40 MG tablet Commonly known as:  PROTONIX Take 40 mg by mouth 2 (two) times daily.   predniSONE 10 MG tablet Commonly known as:  DELTASONE Take 1-2 tablets (10-20 mg total) by mouth daily with breakfast.          Objective:    BP 107/74   Pulse 72   Temp (!) 97 F (36.1 C) (Oral)   Ht 5\' 4"  (1.626 m)   Wt 149 lb 6.4 oz (67.8 kg)   BMI 25.64 kg/m   Allergies  Allergen Reactions  . Ciprofloxacin Hives  . Robitussin (Alcohol Free) [Guaifenesin] Hives    Wt Readings from Last 3 Encounters:  09/08/17 149 lb 6.4 oz (67.8 kg)  07/02/17 158 lb 6.4 oz (71.8 kg)  05/19/17 160 lb (72.6 kg)    Physical Exam  Constitutional: She is oriented to person, place, and time. She appears well-developed and well-nourished.  HENT:  Head: Normocephalic and atraumatic.  Right Ear: Tympanic membrane, external ear and ear canal normal.  Left Ear: Tympanic membrane, external ear and ear canal normal.  Nose: Nose normal. No rhinorrhea.  Mouth/Throat: Oropharynx is clear and moist and mucous membranes are normal. No oropharyngeal exudate or posterior oropharyngeal erythema.  Eyes: Conjunctivae and EOM are normal. Pupils  are equal, round, and reactive to light.  Neck: Normal range of motion. Neck supple.  Cardiovascular: Normal rate, regular rhythm, normal heart sounds and intact distal pulses.  Pulmonary/Chest: Effort normal and breath sounds normal.  Abdominal: Soft. Bowel sounds are normal.  Neurological: She is alert and oriented to person, place, and time. She has normal reflexes.  Skin: Skin is warm and dry. No rash noted.  Psychiatric: She has a normal mood and affect. Her behavior is normal. Judgment and thought content normal.    Results for orders placed or performed in visit on 09/08/17  CBC with Differential/Platelet  Result Value Ref Range   WBC 7.5 3.4 -  10.8 x10E3/uL   RBC 4.52 3.77 - 5.28 x10E6/uL   Hemoglobin 13.5 11.1 - 15.9 g/dL   Hematocrit 41.7 34.0 - 46.6 %   MCV 92 79 - 97 fL   MCH 29.9 26.6 - 33.0 pg   MCHC 32.4 31.5 - 35.7 g/dL   RDW 12.7 12.3 - 15.4 %   Platelets 248 150 - 379 x10E3/uL   Neutrophils 53 Not Estab. %   Lymphs 39 Not Estab. %   Monocytes 6 Not Estab. %   Eos 2 Not Estab. %   Basos 0 Not Estab. %   Neutrophils Absolute 4.0 1.4 - 7.0 x10E3/uL   Lymphocytes Absolute 2.9 0.7 - 3.1 x10E3/uL   Monocytes Absolute 0.4 0.1 - 0.9 x10E3/uL   EOS (ABSOLUTE) 0.2 0.0 - 0.4 x10E3/uL   Basophils Absolute 0.0 0.0 - 0.2 x10E3/uL   Immature Granulocytes 0 Not Estab. %   Immature Grans (Abs) 0.0 0.0 - 0.1 x10E3/uL  Lipid panel  Result Value Ref Range   Cholesterol, Total 142 100 - 199 mg/dL   Triglycerides 67 0 - 149 mg/dL   HDL 58 >39 mg/dL   VLDL Cholesterol Cal 13 5 - 40 mg/dL   LDL Calculated 71 0 - 99 mg/dL   Chol/HDL Ratio 2.4 0.0 - 4.4 ratio  TSH  Result Value Ref Range   TSH 1.550 0.450 - 4.500 uIU/mL      Assessment & Plan:   1. Well adult exam - CBC with Differential/Platelet - Lipid panel - TSH  2. Seborrhea - hydrocortisone 2.5 % cream; Apply topically 2 (two) times daily.  Dispense: 60 g; Refill: 3   Continue all other maintenance medications as listed above.  Follow up plan: Return in about 1 year (around 09/09/2018) for well exam.  Educational handout given for [place] PA-C [place] [address]  [place], [place] [address] [phone]   09/10/2017, 9:29 PM

## 2017-09-23 ENCOUNTER — Telehealth: Payer: Self-pay | Admitting: Physician Assistant

## 2017-09-23 NOTE — Telephone Encounter (Signed)
I put message under daughters name.

## 2017-10-15 ENCOUNTER — Other Ambulatory Visit: Payer: Self-pay | Admitting: Gastroenterology

## 2017-10-15 DIAGNOSIS — R109 Unspecified abdominal pain: Secondary | ICD-10-CM

## 2017-10-19 ENCOUNTER — Ambulatory Visit
Admission: RE | Admit: 2017-10-19 | Discharge: 2017-10-19 | Disposition: A | Discharge status: Home / Self Care | Payer: BLUE CROSS/BLUE SHIELD | Source: Ambulatory Visit | Attending: Gastroenterology | Admitting: Gastroenterology

## 2017-10-19 DIAGNOSIS — R109 Unspecified abdominal pain: Secondary | ICD-10-CM

## 2017-10-19 MED ORDER — IOPAMIDOL (ISOVUE-300) INJECTION 61%
100.0000 mL | Freq: Once | INTRAVENOUS | Status: AC | PRN
  Administered 2017-10-19: 100 mL via INTRAVENOUS

## 2017-10-20 ENCOUNTER — Telehealth: Payer: Self-pay | Admitting: Physician Assistant

## 2017-10-20 NOTE — Telephone Encounter (Signed)
Patient called stating that her dad killed her mom and she has been taking for Cdiff and may need another antibiotic would like for something to be sent to pharmacy

## 2017-10-20 NOTE — Telephone Encounter (Signed)
What does she need, the C diff medication?

## 2017-10-21 ENCOUNTER — Other Ambulatory Visit: Payer: Self-pay | Admitting: Physician Assistant

## 2017-10-21 MED ORDER — METRONIDAZOLE 500 MG PO TABS: 500.0000 mg | ORAL_TABLET | Freq: Three times a day (TID) | ORAL | 0 refills | Status: DC

## 2017-10-21 NOTE — Telephone Encounter (Signed)
sent 

## 2017-10-21 NOTE — Telephone Encounter (Signed)
Patient states that she is still having symptoms of C-Diff. Would like medication send in if possible. Please advise

## 2017-10-21 NOTE — Telephone Encounter (Signed)
lmtcb

## 2017-10-22 NOTE — Telephone Encounter (Signed)
Left message stating requested medication was sent to pharmacy and to call back with any questions or concerns.

## 2017-11-25 ENCOUNTER — Ambulatory Visit: Payer: BLUE CROSS/BLUE SHIELD | Admitting: Physician Assistant

## 2017-11-25 ENCOUNTER — Encounter: Payer: Self-pay | Admitting: Physician Assistant

## 2017-11-25 VITALS — BP 104/67 | HR 71 | Temp 97.4°F | Ht 64.0 in | Wt 140.6 lb

## 2017-11-25 DIAGNOSIS — F4321 Adjustment disorder with depressed mood: Secondary | ICD-10-CM | POA: Diagnosis not present

## 2017-11-25 DIAGNOSIS — R197 Diarrhea, unspecified: Secondary | ICD-10-CM | POA: Diagnosis not present

## 2017-11-25 DIAGNOSIS — F419 Anxiety disorder, unspecified: Secondary | ICD-10-CM | POA: Diagnosis not present

## 2017-11-25 DIAGNOSIS — F4320 Adjustment disorder, unspecified: Secondary | ICD-10-CM

## 2017-11-25 MED ORDER — ALPRAZOLAM 0.25 MG PO TABS: 0.2500 mg | ORAL_TABLET | Freq: Two times a day (BID) | ORAL | 1 refills | Status: DC | PRN

## 2017-11-25 NOTE — Progress Notes (Signed)
BP 104/67   Pulse 71   Temp (!) 97.4 F (36.3 C) (Oral)   Ht 5\' 4"  (1.626 m)   Wt 140 lb 9.6 oz (63.8 kg)   BMI 24.13 kg/m    Subjective:    Patient ID: Dominique Sandoval, female    DOB: [birth year], 52 y.o.   MRN: [id number]  HPI: Dominique Sandoval is a 52 y.o. female presenting on 11/25/2017 for discuss high blood pressure  Patient comes in having episodes of feeling very anxious and panicky.  She will have the headaches start in get very tight and go down her neck and into her chest.  She generally has not had problems with panic attacks in the past.  She had a recent trauma in that her stepfather killed her mother.  She is having to deal with legal proceedings on top of her grief.  Her sleep is very poor.  She has not been on any SSRI at some time.  In addition her IBS has been fairly well controlled.  She was treated for C. difficile.  She will only occasionally have a little bout of loose stools she was concerned that she might of been getting it again.  We can repeat the test.  Past Medical History:  Diagnosis Date  . Carpal tunnel syndrome of right wrist   . Mass of right wrist   . Wears contact lenses    Relevant past medical, surgical, family and social history reviewed and updated as indicated. Interim medical history since our last visit reviewed. Allergies and medications reviewed and updated. DATA REVIEWED: CHART IN EPIC  Family History reviewed for pertinent findings.  Review of Systems  Constitutional: Positive for fatigue. Negative for activity change and fever.  HENT: Negative.   Eyes: Negative.   Respiratory: Negative.  Negative for cough.   Cardiovascular: Positive for chest pain. Negative for palpitations and leg swelling.  Gastrointestinal: Negative.  Negative for abdominal pain.  Endocrine: Negative.   Genitourinary: Negative.  Negative for dysuria.  Musculoskeletal: Negative.   Skin: Negative.   Neurological: Negative.   Psychiatric/Behavioral:  Positive for dysphoric mood and sleep disturbance. The patient is nervous/anxious.     Allergies as of 11/25/2017      Reactions   Ciprofloxacin Hives   Robitussin (alcohol Free) [guaifenesin] Hives      Medication List        Accurate as of 11/25/17 11:59 PM. Always use your most recent med list.          ALPRAZolam 0.25 MG tablet Commonly known as:  XANAX Take 1 tablet (0.25 mg total) by mouth 2 (two) times daily as needed for anxiety.   hydrocortisone 2.5 % cream Apply topically 2 (two) times daily.   pantoprazole 40 MG tablet Commonly known as:  PROTONIX Take 40 mg by mouth 2 (two) times daily.          Objective:    BP 104/67   Pulse 71   Temp (!) 97.4 F (36.3 C) (Oral)   Ht 5\' 4"  (1.626 m)   Wt 140 lb 9.6 oz (63.8 kg)   BMI 24.13 kg/m   Allergies  Allergen Reactions  . Ciprofloxacin Hives  . Robitussin (Alcohol Free) [Guaifenesin] Hives    Wt Readings from Last 3 Encounters:  11/25/17 140 lb 9.6 oz (63.8 kg)  09/08/17 149 lb 6.4 oz (67.8 kg)  07/02/17 158 lb 6.4 oz (71.8 kg)    Physical Exam  Constitutional: She  is oriented to person, place, and time. She appears well-developed and well-nourished.  HENT:  Head: Normocephalic and atraumatic.  Eyes: Pupils are equal, round, and reactive to light. Conjunctivae and EOM are normal.  Cardiovascular: Normal rate, regular rhythm, normal heart sounds and intact distal pulses.  Pulmonary/Chest: Effort normal and breath sounds normal.  Abdominal: Soft. Bowel sounds are normal.  Neurological: She is alert and oriented to person, place, and time. She has normal reflexes.  Skin: Skin is warm and dry. No rash noted.  Psychiatric: She has a normal mood and affect. Her behavior is normal. Judgment and thought content normal.    Results for orders placed or performed in visit on 09/08/17  CBC with Differential/Platelet  Result Value Ref Range   WBC 7.5 3.4 - 10.8 x10E3/uL   RBC 4.52 3.77 - 5.28 x10E6/uL    Hemoglobin 13.5 11.1 - 15.9 g/dL   Hematocrit 41.7 34.0 - 46.6 %   MCV 92 79 - 97 fL   MCH 29.9 26.6 - 33.0 pg   MCHC 32.4 31.5 - 35.7 g/dL   RDW 12.7 12.3 - 15.4 %   Platelets 248 150 - 379 x10E3/uL   Neutrophils 53 Not Estab. %   Lymphs 39 Not Estab. %   Monocytes 6 Not Estab. %   Eos 2 Not Estab. %   Basos 0 Not Estab. %   Neutrophils Absolute 4.0 1.4 - 7.0 x10E3/uL   Lymphocytes Absolute 2.9 0.7 - 3.1 x10E3/uL   Monocytes Absolute 0.4 0.1 - 0.9 x10E3/uL   EOS (ABSOLUTE) 0.2 0.0 - 0.4 x10E3/uL   Basophils Absolute 0.0 0.0 - 0.2 x10E3/uL   Immature Granulocytes 0 Not Estab. %   Immature Grans (Abs) 0.0 0.0 - 0.1 x10E3/uL  Lipid panel  Result Value Ref Range   Cholesterol, Total 142 100 - 199 mg/dL   Triglycerides 67 0 - 149 mg/dL   HDL 58 >39 mg/dL   VLDL Cholesterol Cal 13 5 - 40 mg/dL   LDL Calculated 71 0 - 99 mg/dL   Chol/HDL Ratio 2.4 0.0 - 4.4 ratio  TSH  Result Value Ref Range   TSH 1.550 0.450 - 4.500 uIU/mL      Assessment & Plan:   1. Diarrhea of presumed infectious origin - Clostridium Difficile by PCR([facility])  2. Anxiety Restart Lexapro 5 mg, patient has medication at home Alprazolam 0.25 mg is given  3. Grief reaction Continue counseling   Continue all other maintenance medications as listed above.  Follow up plan: Return in about 6 weeks (around 01/06/2018).  Educational handout given for [place] PA-C [place] [address]  [place], [place] [address] [phone]   11/26/2017, 1:24 PM

## 2018-01-08 ENCOUNTER — Other Ambulatory Visit: Payer: Self-pay | Admitting: Physician Assistant

## 2018-01-08 MED ORDER — ALPRAZOLAM 0.25 MG PO TABS: 0.2500 mg | ORAL_TABLET | Freq: Two times a day (BID) | ORAL | 1 refills | Status: DC | PRN

## 2018-03-05 ENCOUNTER — Telehealth: Payer: Self-pay | Admitting: Physician Assistant

## 2018-03-05 NOTE — Telephone Encounter (Addendum)
Patient states that when she increased her Lexapro to 20mg  it made her angry and it also increased her anxiety level. Patient did go back down to the 10mg  and she states that she always feels like she is on a egg shell. She wants to know if she can increase to 15mg  and cut one of the 10mg  in half? Patient also states that when she went out of town things seemed to be better but now that she is back she is unable to sleep again and all these symptoms have returned.

## 2018-03-05 NOTE — Telephone Encounter (Signed)
Pt aware.

## 2018-03-05 NOTE — Telephone Encounter (Signed)
Okay to do the 15 mg as described.

## 2018-03-12 ENCOUNTER — Ambulatory Visit: Payer: No Typology Code available for payment source | Admitting: Family Medicine

## 2018-03-12 ENCOUNTER — Encounter: Payer: Self-pay | Admitting: Family Medicine

## 2018-03-12 VITALS — BP 116/73 | HR 74 | Temp 97.5°F | Ht 64.0 in | Wt 134.8 lb

## 2018-03-12 DIAGNOSIS — J0110 Acute frontal sinusitis, unspecified: Secondary | ICD-10-CM | POA: Diagnosis not present

## 2018-03-12 MED ORDER — CEFDINIR 300 MG PO CAPS: 300.0000 mg | ORAL_CAPSULE | Freq: Two times a day (BID) | ORAL | 0 refills | Status: DC

## 2018-03-12 NOTE — Progress Notes (Signed)
BP 116/73   Pulse 74   Temp (!) 97.5 F (36.4 C) (Oral)   Ht 5\' 4"  (1.626 m)   Wt 134 lb 12.8 oz (61.1 kg)   BMI 23.14 kg/m    Subjective:    Patient ID: Dominique Sandoval, female    DOB: Jul 27, 1965, 52 y.o.   MRN: [id number]  HPI: Dominique Sandoval is a 52 y.o. female presenting on 03/12/2018 for Cough (doesn't feel like she has a fever, just hot/cold flashes) and Ear Pain (doesn't feel like ear is draining, had sinus issues recently, staying really thursty & constantly feels like she has to swallow at night)   HPI Cough and congestion and ear pain and sinus drainage Patient comes in today complaining of cough and congestion and ear pain and sinus drainage and sore throat especially on the left side that has been going on for the past 3 weeks.  She says a lot of the other symptoms including the feverishness have cleared up but she still had a lot of drainage and cough and sinus pressure that has not cleared up.  She denies any sick contacts that she knows of.  She does admit getting some allergies this time year.  She has tried using Mucinex which she felt like did not really help and possibly be made it worse or more dry.  Relevant past medical, surgical, family and social history reviewed and updated as indicated. Interim medical history since our last visit reviewed. Allergies and medications reviewed and updated.  Review of Systems  Constitutional: Negative for chills and fever.  HENT: Positive for congestion, postnasal drip, rhinorrhea, sinus pressure, sneezing and sore throat. Negative for ear discharge and ear pain.   Eyes: Negative for pain, redness and visual disturbance.  Respiratory: Positive for cough. Negative for chest tightness and shortness of breath.   Cardiovascular: Negative for chest pain and leg swelling.  Genitourinary: Negative for difficulty urinating and dysuria.  Musculoskeletal: Negative for back pain and gait problem.  Skin: Negative for rash.  Neurological:  Negative for light-headedness and headaches.  Psychiatric/Behavioral: Negative for agitation and behavioral problems.  All other systems reviewed and are negative.   Per HPI unless specifically indicated above   Allergies as of 03/12/2018      Reactions   Ciprofloxacin Hives   Robitussin (alcohol Free) [guaifenesin] Hives      Medication List        Accurate as of 03/12/18  5:57 PM. Always use your most recent med list.          ALPRAZolam 0.25 MG tablet Commonly known as:  XANAX Take 1 tablet (0.25 mg total) by mouth 2 (two) times daily as needed for anxiety.   cefdinir 300 MG capsule Commonly known as:  OMNICEF Take 1 capsule (300 mg total) by mouth 2 (two) times daily. 1 po BID   escitalopram 10 MG tablet Commonly known as:  LEXAPRO Take 10 mg by mouth daily.   hydrocortisone 2.5 % cream Apply topically 2 (two) times daily.   pantoprazole 40 MG tablet Commonly known as:  PROTONIX Take 40 mg by mouth 2 (two) times daily.          Objective:    BP 116/73   Pulse 74   Temp (!) 97.5 F (36.4 C) (Oral)   Ht 5\' 4"  (1.626 m)   Wt 134 lb 12.8 oz (61.1 kg)   BMI 23.14 kg/m   Wt Readings from Last 3 Encounters:  03/12/18 134  lb 12.8 oz (61.1 kg)  11/25/17 140 lb 9.6 oz (63.8 kg)  09/08/17 149 lb 6.4 oz (67.8 kg)    Physical Exam  Constitutional: She is oriented to person, place, and time. She appears well-developed and well-nourished. No distress.  HENT:  Right Ear: Tympanic membrane, external ear and ear canal normal.  Left Ear: Tympanic membrane, external ear and ear canal normal.  Nose: Mucosal edema and rhinorrhea present. No epistaxis. Right sinus exhibits frontal sinus tenderness. Right sinus exhibits no maxillary sinus tenderness. Left sinus exhibits frontal sinus tenderness. Left sinus exhibits no maxillary sinus tenderness.  Mouth/Throat: Uvula is midline and mucous membranes are normal. Posterior oropharyngeal edema and posterior oropharyngeal  erythema present. No oropharyngeal exudate or tonsillar abscesses.  Eyes: Conjunctivae and EOM are normal.  Cardiovascular: Normal rate, regular rhythm, normal heart sounds and intact distal pulses.  No murmur heard. Pulmonary/Chest: Effort normal and breath sounds normal. No respiratory distress. She has no wheezes.  Musculoskeletal: Normal range of motion. She exhibits no edema or tenderness.  Neurological: She is alert and oriented to person, place, and time. Coordination normal.  Skin: Skin is warm and dry. No rash noted. She is not diaphoretic.  Psychiatric: She has a normal mood and affect. Her behavior is normal.  Vitals reviewed.       Assessment & Plan:   Problem List Items Addressed This Visit    None    Visit Diagnoses    Acute non-recurrent frontal sinusitis    -  Primary   Relevant Medications   cefdinir (OMNICEF) 300 MG capsule       Follow up plan: Return if symptoms worsen or fail to improve.  Counseling provided for all of the vaccine components No orders of the defined types were placed in this encounter.   Caryl Pina, MD [facility] 03/12/2018, 5:57 PM

## 2018-04-12 ENCOUNTER — Ambulatory Visit: Payer: No Typology Code available for payment source | Admitting: Physician Assistant

## 2018-04-12 ENCOUNTER — Telehealth: Payer: Self-pay | Admitting: Physician Assistant

## 2018-04-15 NOTE — Telephone Encounter (Signed)
Appointment scheduled for patient

## 2018-04-19 ENCOUNTER — Encounter: Payer: Self-pay | Admitting: Physician Assistant

## 2018-04-19 ENCOUNTER — Ambulatory Visit: Payer: No Typology Code available for payment source | Admitting: Physician Assistant

## 2018-04-19 VITALS — BP 125/83 | HR 75 | Temp 98.9°F | Ht 64.0 in | Wt 138.0 lb

## 2018-04-19 DIAGNOSIS — L219 Seborrheic dermatitis, unspecified: Secondary | ICD-10-CM | POA: Diagnosis not present

## 2018-04-19 DIAGNOSIS — R1084 Generalized abdominal pain: Secondary | ICD-10-CM

## 2018-04-19 DIAGNOSIS — F419 Anxiety disorder, unspecified: Secondary | ICD-10-CM

## 2018-04-19 DIAGNOSIS — K219 Gastro-esophageal reflux disease without esophagitis: Secondary | ICD-10-CM | POA: Diagnosis not present

## 2018-04-19 MED ORDER — FAMOTIDINE 40 MG PO TABS: 40.0000 mg | ORAL_TABLET | Freq: Every day | ORAL | 11 refills | Status: DC

## 2018-04-19 MED ORDER — ESCITALOPRAM OXALATE 10 MG PO TABS: 10.0000 mg | ORAL_TABLET | Freq: Every day | ORAL | 11 refills | Status: DC

## 2018-04-19 MED ORDER — HYDROCORTISONE 2.5 % EX CREA: TOPICAL_CREAM | Freq: Two times a day (BID) | CUTANEOUS | 3 refills | Status: DC

## 2018-04-19 MED ORDER — PANTOPRAZOLE SODIUM 40 MG PO TBEC: 40.0000 mg | DELAYED_RELEASE_TABLET | Freq: Two times a day (BID) | ORAL | 11 refills | Status: DC

## 2018-04-19 MED ORDER — ALPRAZOLAM 0.25 MG PO TABS: 0.2500 mg | ORAL_TABLET | Freq: Two times a day (BID) | ORAL | 2 refills | Status: DC | PRN

## 2018-04-19 MED ORDER — METHYLPREDNISOLONE ACETATE 80 MG/ML IJ SUSP
80.0000 mg | Freq: Once | INTRAMUSCULAR | Status: AC
  Administered 2018-04-19: 80 mg via INTRAMUSCULAR

## 2018-04-19 NOTE — Progress Notes (Signed)
BP 125/83   Pulse 75   Temp 98.9 F (37.2 C) (Oral)   Ht 5\' 4"  (1.626 m)   Wt 138 lb (62.6 kg)   BMI 23.69 kg/m    Subjective:    Patient ID: Dominique Sandoval, female    DOB: 1965/08/22, 52 y.o.   MRN: [id number]  HPI: Dominique Sandoval is a 52 y.o. female presenting on 04/19/2018 for stomach makes noises all the time   This patient comes in for difficulty with her stomach.  She has had GERD issues long ago in the past and had taken medicine but it was able to get off of them.  However in recent weeks she has had a great increase in the amount of stomach pain and hyperactivity. She has had a significant amount of stress going on related to her mother being killed by her stepfather.  There is a lot of legal activity going on.  She has tried to eat and drink and sleep well.  Past Medical History:  Diagnosis Date  . Carpal tunnel syndrome of right wrist   . Mass of right wrist   . Wears contact lenses    Relevant past medical, surgical, family and social history reviewed and updated as indicated. Interim medical history since our last visit reviewed. Allergies and medications reviewed and updated. DATA REVIEWED: CHART IN EPIC  Family History reviewed for pertinent findings.  Review of Systems  Constitutional: Negative.   HENT: Negative.   Eyes: Negative.   Respiratory: Negative.   Gastrointestinal: Positive for abdominal distention and abdominal pain. Negative for constipation, diarrhea, nausea and vomiting.  Genitourinary: Negative.     Allergies as of 04/19/2018      Reactions   Ciprofloxacin Hives   Robitussin (alcohol Free) [guaifenesin] Hives      Medication List        Accurate as of 04/19/18 11:59 PM. Always use your most recent med list.          ALPRAZolam 0.25 MG tablet Commonly known as:  XANAX Take 1 tablet (0.25 mg total) by mouth 2 (two) times daily as needed for anxiety.   escitalopram 10 MG tablet Commonly known as:  LEXAPRO Take 1 tablet  (10 mg total) by mouth daily.   famotidine 40 MG tablet Commonly known as:  PEPCID Take 1 tablet (40 mg total) by mouth daily.   hydrocortisone 2.5 % cream Apply topically 2 (two) times daily.   pantoprazole 40 MG tablet Commonly known as:  PROTONIX Take 1 tablet (40 mg total) by mouth 2 (two) times daily.          Objective:    BP 125/83   Pulse 75   Temp 98.9 F (37.2 C) (Oral)   Ht 5\' 4"  (1.626 m)   Wt 138 lb (62.6 kg)   BMI 23.69 kg/m   Allergies  Allergen Reactions  . Ciprofloxacin Hives  . Robitussin (Alcohol Free) [Guaifenesin] Hives    Wt Readings from Last 3 Encounters:  04/19/18 138 lb (62.6 kg)  03/12/18 134 lb 12.8 oz (61.1 kg)  11/25/17 140 lb 9.6 oz (63.8 kg)    Physical Exam  Constitutional: She is oriented to person, place, and time. She appears well-developed and well-nourished.  HENT:  Head: Normocephalic and atraumatic.  Eyes: Pupils are equal, round, and reactive to light. Conjunctivae and EOM are normal.  Cardiovascular: Normal rate, regular rhythm, normal heart sounds and intact distal pulses.  Pulmonary/Chest: Effort normal and breath  sounds normal.  Abdominal: Soft. She exhibits no distension and no fluid wave. Bowel sounds are increased. There is no hepatosplenomegaly. There is no tenderness. No hernia.  Neurological: She is alert and oriented to person, place, and time. She has normal reflexes.  Skin: Skin is warm and dry. No rash noted.  Psychiatric: She has a normal mood and affect. Her behavior is normal. Judgment and thought content normal.    Results for orders placed or performed in visit on 09/08/17  CBC with Differential/Platelet  Result Value Ref Range   WBC 7.5 3.4 - 10.8 x10E3/uL   RBC 4.52 3.77 - 5.28 x10E6/uL   Hemoglobin 13.5 11.1 - 15.9 g/dL   Hematocrit 41.7 34.0 - 46.6 %   MCV 92 79 - 97 fL   MCH 29.9 26.6 - 33.0 pg   MCHC 32.4 31.5 - 35.7 g/dL   RDW 12.7 12.3 - 15.4 %   Platelets 248 150 - 379 x10E3/uL    Neutrophils 53 Not Estab. %   Lymphs 39 Not Estab. %   Monocytes 6 Not Estab. %   Eos 2 Not Estab. %   Basos 0 Not Estab. %   Neutrophils Absolute 4.0 1.4 - 7.0 x10E3/uL   Lymphocytes Absolute 2.9 0.7 - 3.1 x10E3/uL   Monocytes Absolute 0.4 0.1 - 0.9 x10E3/uL   EOS (ABSOLUTE) 0.2 0.0 - 0.4 x10E3/uL   Basophils Absolute 0.0 0.0 - 0.2 x10E3/uL   Immature Granulocytes 0 Not Estab. %   Immature Grans (Abs) 0.0 0.0 - 0.1 x10E3/uL  Lipid panel  Result Value Ref Range   Cholesterol, Total 142 100 - 199 mg/dL   Triglycerides 67 0 - 149 mg/dL   HDL 58 >39 mg/dL   VLDL Cholesterol Cal 13 5 - 40 mg/dL   LDL Calculated 71 0 - 99 mg/dL   Chol/HDL Ratio 2.4 0.0 - 4.4 ratio  TSH  Result Value Ref Range   TSH 1.550 0.450 - 4.500 uIU/mL      Assessment & Plan:   1. Seborrhea - hydrocortisone 2.5 % cream; Apply topically 2 (two) times daily.  Dispense: 60 g; Refill: 3  2. Anxiety - escitalopram (LEXAPRO) 10 MG tablet; Take 1 tablet (10 mg total) by mouth daily.  Dispense: 30 tablet; Refill: 11 - ALPRAZolam (XANAX) 0.25 MG tablet; Take 1 tablet (0.25 mg total) by mouth 2 (two) times daily as needed for anxiety.  Dispense: 60 tablet; Refill: 2  3. Generalized abdominal pain - famotidine (PEPCID) 40 MG tablet; Take 1 tablet (40 mg total) by mouth daily.  Dispense: 30 tablet; Refill: 11 - methylPREDNISolone acetate (DEPO-MEDROL) injection 80 mg  4. Gastroesophageal reflux disease without esophagitis - famotidine (PEPCID) 40 MG tablet; Take 1 tablet (40 mg total) by mouth daily.  Dispense: 30 tablet; Refill: 11 - pantoprazole (PROTONIX) 40 MG tablet; Take 1 tablet (40 mg total) by mouth 2 (two) times daily.  Dispense: 60 tablet; Refill: 11   Continue all other maintenance medications as listed above.  Follow up plan: No follow-ups on file.  Educational handout given for [place] PA-C [place] [address]  [place], [place]  [address] [phone]   04/20/2018, 1:22 PM

## 2018-08-31 ENCOUNTER — Other Ambulatory Visit: Payer: Self-pay | Admitting: Physician Assistant

## 2018-08-31 ENCOUNTER — Telehealth: Payer: Self-pay | Admitting: Physician Assistant

## 2018-08-31 MED ORDER — OSELTAMIVIR PHOSPHATE 75 MG PO CAPS: 75.0000 mg | ORAL_CAPSULE | Freq: Two times a day (BID) | ORAL | 0 refills | Status: DC

## 2018-08-31 NOTE — Telephone Encounter (Signed)
Patient aware.

## 2018-08-31 NOTE — Telephone Encounter (Signed)
Tamiflu sent.

## 2018-09-10 ENCOUNTER — Encounter: Payer: No Typology Code available for payment source | Admitting: Physician Assistant

## 2018-10-04 ENCOUNTER — Other Ambulatory Visit: Payer: Self-pay

## 2018-10-04 ENCOUNTER — Encounter: Payer: Self-pay | Admitting: Physician Assistant

## 2018-10-04 ENCOUNTER — Ambulatory Visit (INDEPENDENT_AMBULATORY_CARE_PROVIDER_SITE_OTHER): Payer: PRIVATE HEALTH INSURANCE | Admitting: Physician Assistant

## 2018-10-04 DIAGNOSIS — F419 Anxiety disorder, unspecified: Secondary | ICD-10-CM | POA: Diagnosis not present

## 2018-10-04 DIAGNOSIS — Z0000 Encounter for general adult medical examination without abnormal findings: Secondary | ICD-10-CM

## 2018-10-04 MED ORDER — PREDNISONE 10 MG PO TABS: 10.0000 mg | ORAL_TABLET | Freq: Every day | ORAL | 0 refills | Status: DC

## 2018-10-04 MED ORDER — ALPRAZOLAM 0.25 MG PO TABS: 0.2500 mg | ORAL_TABLET | Freq: Two times a day (BID) | ORAL | 2 refills | Status: DC | PRN

## 2018-10-04 MED ORDER — AZITHROMYCIN 250 MG PO TABS: ORAL_TABLET | ORAL | 0 refills | Status: DC
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
## 2018-12-09 IMAGING — CT CT ABD-PELV W/ CM
1 of 3 series · 13 of 32 positions shown, 18 images · IV contrast (APPLIED)
Comparison: None.

CLINICAL DATA: Abdominal pain for 3 months.

EXAM:
CT ABDOMEN AND PELVIS WITH CONTRAST
TECHNIQUE: Multidetector CT imaging of the abdomen and pelvis was performed
using the standard protocol following bolus administration of
intravenous contrast.
CONTRAST:  100mL 621WXH-TFF IOPAMIDOL (621WXH-TFF) INJECTION 61%

[Series 2: abd/pelvis w/cm · axial · 0.68mm/px · z∈[-352,+103]mm · 13 of 105 slices shown, 18 images]
[im 7/105  soft-tissue]
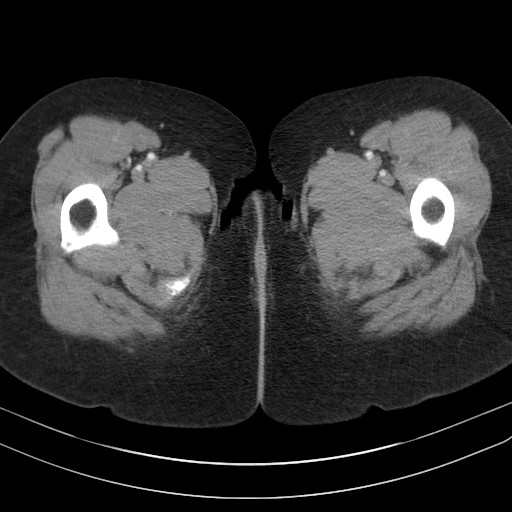
[im 7/105  bone]
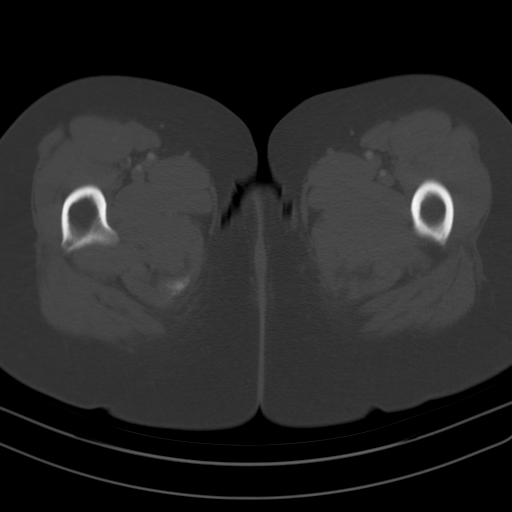
[im 14/105  soft-tissue]
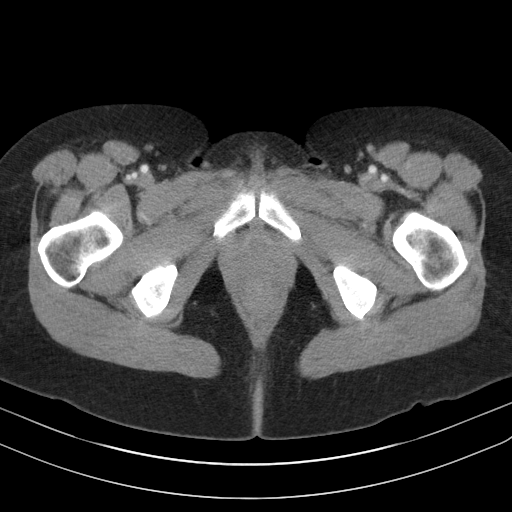
[im 27/105  soft-tissue]
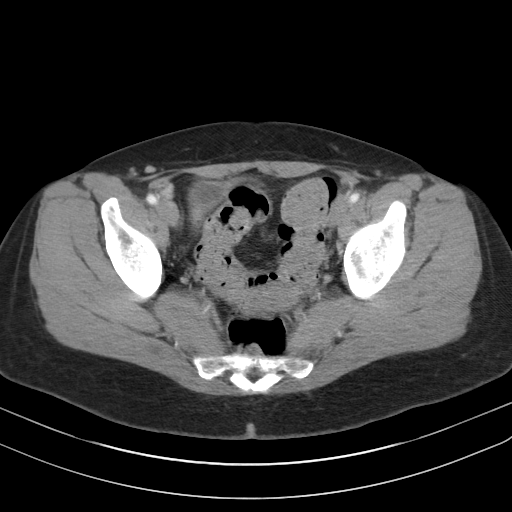
[im 33/105  soft-tissue]
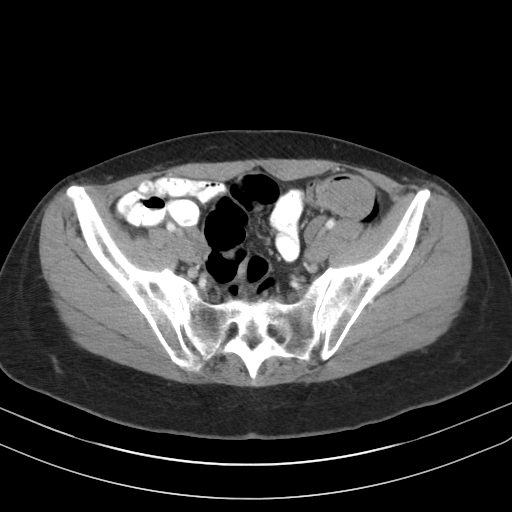
[im 40/105  soft-tissue]
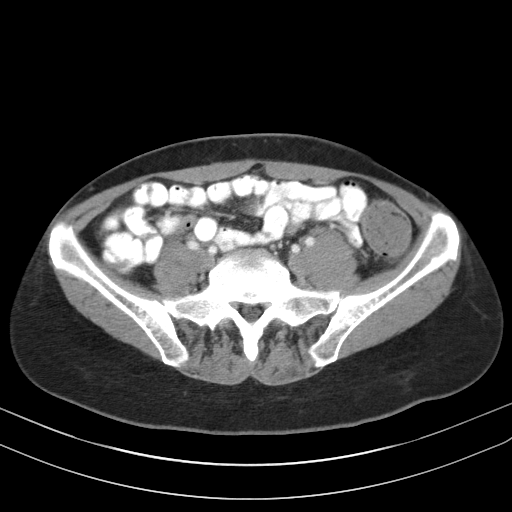
[im 46/105  soft-tissue]
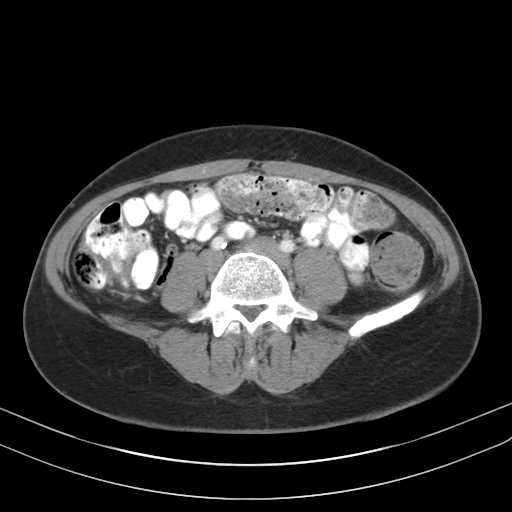
[im 59/105  soft-tissue]
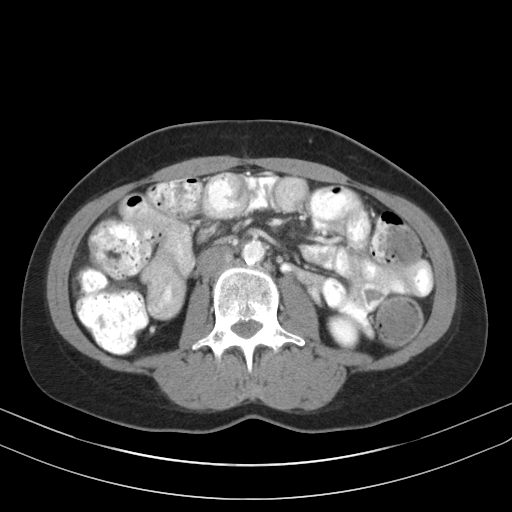
[im 66/105  soft-tissue]
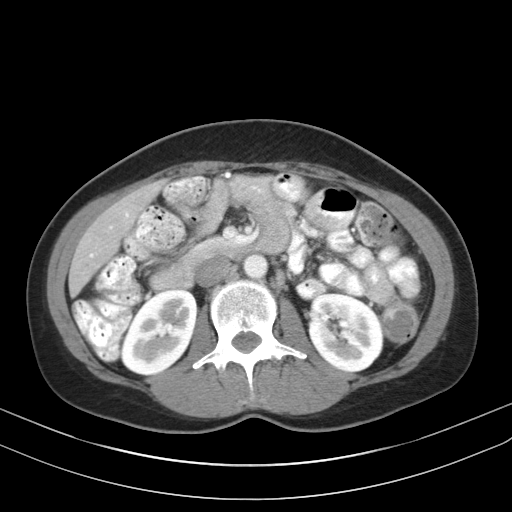
[im 72/105  soft-tissue]
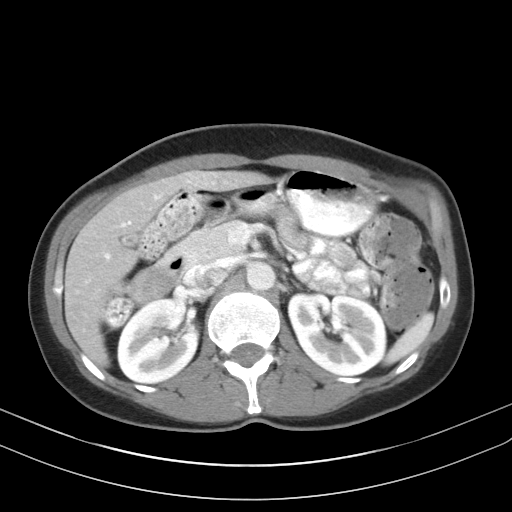
[im 72/105  bone]
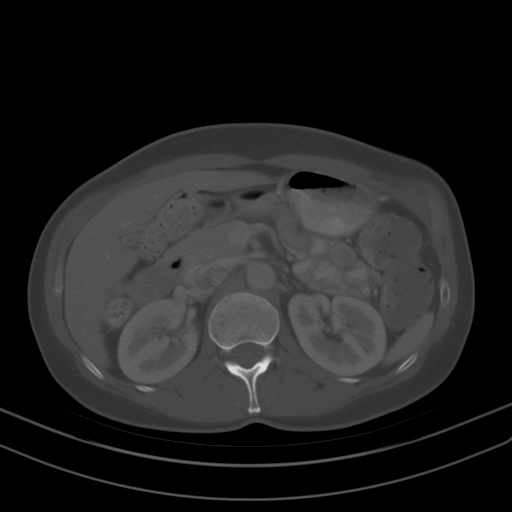
[im 79/105  soft-tissue]
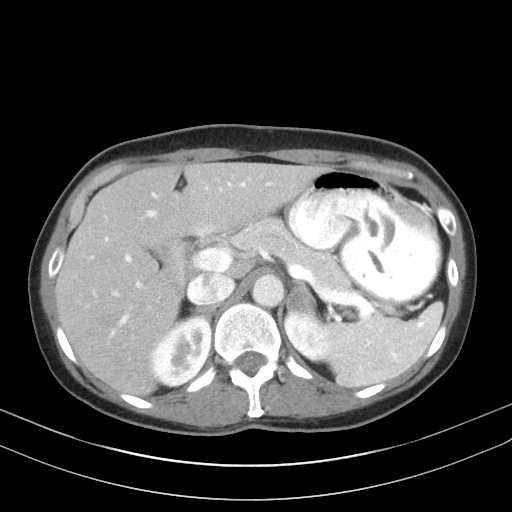
[im 79/105  lung]
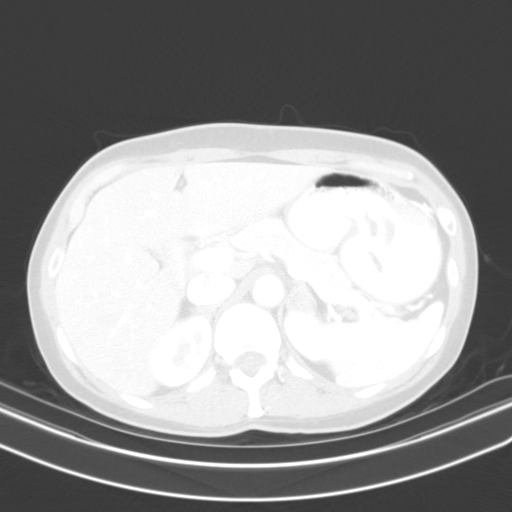
[im 85/105  lung]
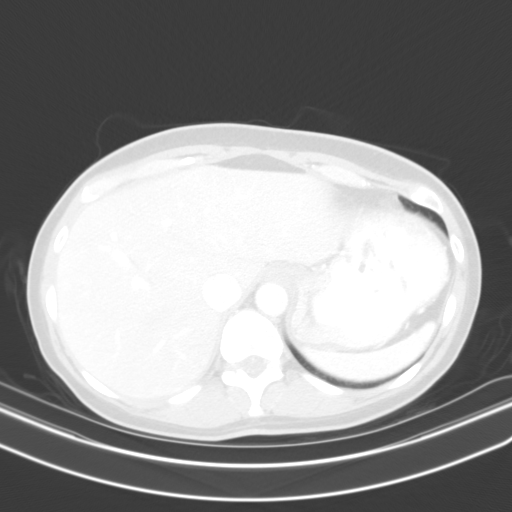
[im 92/105  soft-tissue]
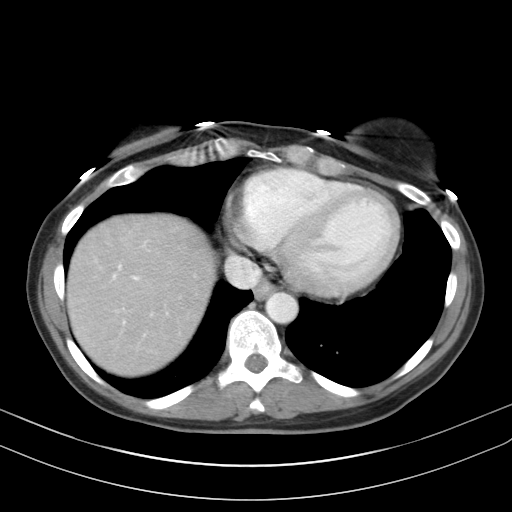
[im 92/105  lung]
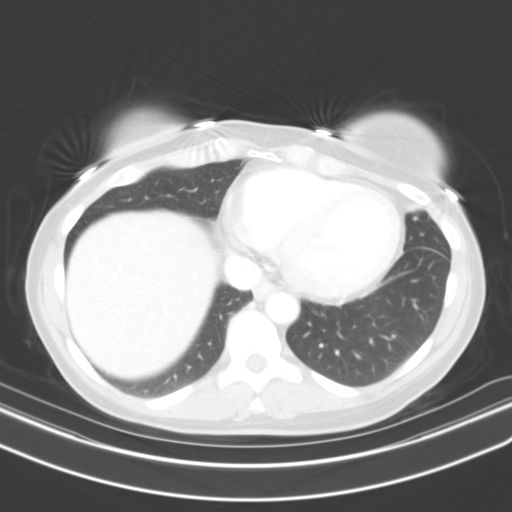
[im 98/105  soft-tissue]
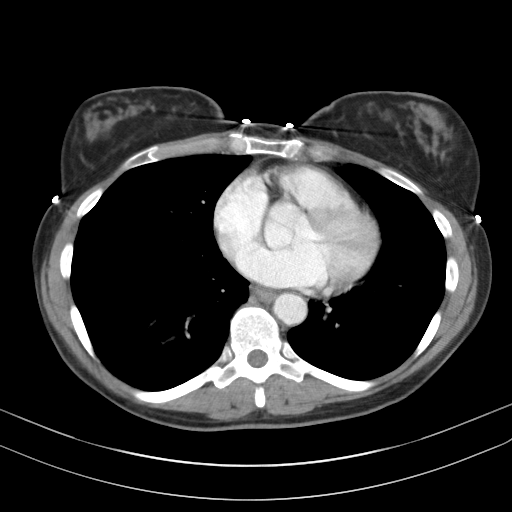
[im 98/105  lung]
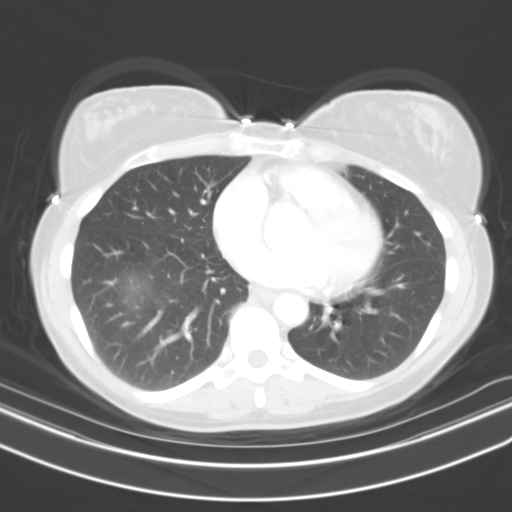

[13 of 32 positions shown; findings below may reference images not displayed]

FINDINGS: Lower chest: The lung bases are clear of acute process. No pleural
effusion or pulmonary lesions. The heart is normal in size. No
pericardial effusion. The distal esophagus and aorta are
unremarkable.

Hepatobiliary: No focal hepatic lesions or intrahepatic biliary
dilatation. Gallbladder is contracted. No common bile duct
dilatation.

Pancreas: No mass, inflammation or ductal dilatation.

Spleen: Normal size.  No focal lesions.

Adrenals/Urinary Tract: Bilateral adrenal gland lesions are noted.
These demonstrate significant washout with low attenuation on the
delayed images with the left measuring 20 Hounsfield units on the
right measuring 11 Hounsfield units. Findings consistent with benign
adrenal gland adenomas.

The kidneys, ureters and bladder are unremarkable. No renal,
ureteral or bladder calculi or mass.

Stomach/Bowel: The stomach, duodenum, small bowel and colon are
unremarkable. No acute inflammatory process, mass lesions or
obstructive findings. The terminal ileum and appendix are normal.
Incidental jejunal intussusception, likely transient and not
clinically significant.

Vascular/Lymphatic: Age advanced atherosclerotic calcifications
involving the abdominal aorta and iliac arteries but no aneurysm or
dissection. The branch vessels are patent. The major venous
structures are patent.

No mesenteric or retroperitoneal mass or adenopathy.

Reproductive: The uterus is surgically absent. Both ovaries are
still present and appear normal.

Other: No pelvic mass or adenopathy. No free pelvic fluid
collections. No inguinal mass or adenopathy. No abdominal wall
hernia or subcutaneous lesions.

Musculoskeletal: No significant bony findings.
IMPRESSION: 1. No acute abdominal/pelvic findings, mass lesions or
lymphadenopathy.
2. Bilateral adrenal gland lesions most consistent with benign
adrenal gland adenomas. I would recommend a follow-up noncontrast
abdominal CT scan in 4-6 months to document stability.
3. Status post hysterectomy.  Both ovaries appear normal.
4. Age advanced atherosclerotic calcifications involving the distal
aorta and iliac arteries.

## 2019-02-17 ENCOUNTER — Other Ambulatory Visit: Payer: Self-pay | Admitting: Physician Assistant

## 2019-02-17 MED ORDER — PREDNISONE 10 MG PO TABS: 10.0000 mg | ORAL_TABLET | Freq: Every day | ORAL | 0 refills | Status: DC

## 2019-02-17 NOTE — Telephone Encounter (Signed)
sent 

## 2019-02-17 NOTE — Telephone Encounter (Signed)
Last RF 10/04/18 Please advise

## 2019-02-17 NOTE — Telephone Encounter (Signed)
What is the name of the medication? Prednisone 10mg  #60  Have you contacted your pharmacy to request a refill? NO  Which pharmacy would you like this sent to? [facility] in [place]   Patient notified that their request is being sent to the clinical staff for review and that they should receive a call once it is complete. If they do not receive a call within 24 hours they can check with their pharmacy or our office.

## 2019-02-18 NOTE — Telephone Encounter (Signed)
Left detailed message per DPR  

## 2019-03-16 ENCOUNTER — Ambulatory Visit: Payer: PRIVATE HEALTH INSURANCE | Admitting: Physician Assistant

## 2019-03-28 ENCOUNTER — Other Ambulatory Visit: Payer: Self-pay

## 2019-03-28 ENCOUNTER — Ambulatory Visit (INDEPENDENT_AMBULATORY_CARE_PROVIDER_SITE_OTHER): Payer: PRIVATE HEALTH INSURANCE | Admitting: Physician Assistant

## 2019-03-28 DIAGNOSIS — F419 Anxiety disorder, unspecified: Secondary | ICD-10-CM | POA: Diagnosis not present

## 2019-03-28 DIAGNOSIS — R030 Elevated blood-pressure reading, without diagnosis of hypertension: Secondary | ICD-10-CM
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
## 2019-03-30 ENCOUNTER — Encounter: Payer: Self-pay | Admitting: Physician Assistant

## 2019-04-18 ENCOUNTER — Other Ambulatory Visit: Payer: Self-pay | Admitting: Physician Assistant

## 2019-04-18 ENCOUNTER — Telehealth: Payer: Self-pay | Admitting: Physician Assistant

## 2019-04-18 MED ORDER — AMOXICILLIN-POT CLAVULANATE 875-125 MG PO TABS: 1.0000 | ORAL_TABLET | Freq: Two times a day (BID) | ORAL | 0 refills | Status: DC

## 2019-04-18 NOTE — Telephone Encounter (Signed)
Patient aware.

## 2019-04-18 NOTE — Telephone Encounter (Signed)
Sent augmentin to pharmacy.

## 2019-04-26 ENCOUNTER — Other Ambulatory Visit: Payer: Self-pay | Admitting: Physician Assistant

## 2019-04-26 DIAGNOSIS — F419 Anxiety disorder, unspecified: Secondary | ICD-10-CM

## 2019-06-22 ENCOUNTER — Ambulatory Visit: Payer: PRIVATE HEALTH INSURANCE | Admitting: Family Medicine

## 2019-08-02 ENCOUNTER — Ambulatory Visit: Payer: PRIVATE HEALTH INSURANCE | Admitting: Nurse Practitioner

## 2019-08-02 ENCOUNTER — Encounter: Payer: Self-pay | Admitting: Nurse Practitioner

## 2019-08-02 ENCOUNTER — Other Ambulatory Visit: Payer: Self-pay

## 2019-08-02 VITALS — BP 98/63 | HR 69 | Temp 96.2°F | Resp 20 | Ht 64.0 in | Wt 140.0 lb

## 2019-08-02 DIAGNOSIS — M545 Low back pain, unspecified: Secondary | ICD-10-CM

## 2019-08-02 DIAGNOSIS — R8290 Unspecified abnormal findings in urine: Secondary | ICD-10-CM

## 2019-08-02 LAB — URINALYSIS, COMPLETE
Bilirubin, UA: NEGATIVE
Glucose, UA: NEGATIVE
Ketones, UA: NEGATIVE
Leukocytes,UA: NEGATIVE
Nitrite, UA: NEGATIVE
Protein,UA: NEGATIVE
Specific Gravity, UA: 1.015 (ref 1.005–1.030)
Urobilinogen, Ur: 0.2 mg/dL (ref 0.2–1.0)
pH, UA: 5.5 (ref 5.0–7.5)

## 2019-08-02 LAB — MICROSCOPIC EXAMINATION: Renal Epithel, UA: NONE SEEN /hpf

## 2019-08-02 MED ORDER — CYCLOBENZAPRINE HCL 10 MG PO TABS: 10.0000 mg | ORAL_TABLET | Freq: Three times a day (TID) | ORAL | 1 refills | Status: DC | PRN

## 2019-08-02 MED ORDER — NAPROXEN 500 MG PO TABS: 500.0000 mg | ORAL_TABLET | Freq: Two times a day (BID) | ORAL | 1 refills | Status: DC

## 2019-08-02 NOTE — Patient Instructions (Signed)
Acute Back Pain, Adult Acute back pain is sudden and usually short-lived. It is often caused by an injury to the muscles and tissues in the back. The injury may result from:  A muscle or ligament getting overstretched or torn (strained). Ligaments are tissues that connect bones to each other. Lifting something improperly can cause a back strain.  Wear and tear (degeneration) of the spinal disks. Spinal disks are circular tissue that provides cushioning between the bones of the spine (vertebrae).  Twisting motions, such as while playing sports or doing yard work.  A hit to the back.  Arthritis. You may have a physical exam, lab tests, and imaging tests to find the cause of your pain. Acute back pain usually goes away with rest and home care. Follow these instructions at home: Managing pain, stiffness, and swelling  Take over-the-counter and prescription medicines only as told by your health care provider.  Your health care provider may recommend applying ice during the first 24-48 hours after your pain starts. To do this: ? Put ice in a plastic bag. ? Place a towel between your skin and the bag. ? Leave the ice on for 20 minutes, 2-3 times a day.  If directed, apply heat to the affected area as often as told by your health care provider. Use the heat source that your health care provider recommends, such as a moist heat pack or a heating pad. ? Place a towel between your skin and the heat source. ? Leave the heat on for 20-30 minutes. ? Remove the heat if your skin turns bright red. This is especially important if you are unable to feel pain, heat, or cold. You have a greater risk of getting burned. Activity   Do not stay in bed. Staying in bed for more than 1-2 days can delay your recovery.  Sit up and stand up straight. Avoid leaning forward when you sit, or hunching over when you stand. ? If you work at a desk, sit close to it so you do not need to lean over. Keep your chin tucked  in. Keep your neck drawn back, and keep your elbows bent at a right angle. Your arms should look like the letter "L." ? Sit high and close to the steering wheel when you drive. Add lower back (lumbar) support to your car seat, if needed.  Take short walks on even surfaces as soon as you are able. Try to increase the length of time you walk each day.  Do not sit, drive, or stand in one place for more than 30 minutes at a time. Sitting or standing for long periods of time can put stress on your back.  Do not drive or use heavy machinery while taking prescription pain medicine.  Use proper lifting techniques. When you bend and lift, use positions that put less stress on your back: ? Bend your knees. ? Keep the load close to your body. ? Avoid twisting.  Exercise regularly as told by your health care provider. Exercising helps your back heal faster and helps prevent back injuries by keeping muscles strong and flexible.  Work with a physical therapist to make a safe exercise program, as recommended by your health care provider. Do any exercises as told by your physical therapist. Lifestyle  Maintain a healthy weight. Extra weight puts stress on your back and makes it difficult to have good posture.  Avoid activities or situations that make you feel anxious or stressed. Stress and anxiety increase muscle   tension and can make back pain worse. Learn ways to manage anxiety and stress, such as through exercise. General instructions  Sleep on a firm mattress in a comfortable position. Try lying on your side with your knees slightly bent. If you lie on your back, put a pillow under your knees.  Follow your treatment plan as told by your health care provider. This may include: ? Cognitive or behavioral therapy. ? Acupuncture or massage therapy. ? Meditation or yoga. Contact a health care provider if:  You have pain that is not relieved with rest or medicine.  You have increasing pain going down  into your legs or buttocks.  Your pain does not improve after 2 weeks.  You have pain at night.  You lose weight without trying.  You have a fever or chills. Get help right away if:  You develop new bowel or bladder control problems.  You have unusual weakness or numbness in your arms or legs.  You develop nausea or vomiting.  You develop abdominal pain.  You feel faint. Summary  Acute back pain is sudden and usually short-lived.  Use proper lifting techniques. When you bend and lift, use positions that put less stress on your back.  Take over-the-counter and prescription medicines and apply heat or ice as directed by your health care provider. This information is not intended to replace advice given to you by your health care provider. Make sure you discuss any questions you have with your health care provider. Document Revised: 10/05/2018 Document Reviewed: 01/28/2017 Elsevier Patient Education  2020 [employer]  

## 2019-08-02 NOTE — Progress Notes (Signed)
   Subjective:    Patient ID: Dominique Sandoval, female    DOB: 08/06/1965, 54 y.o.   MRN: [id number]   Chief Complaint: Back Pain and foul smelling urine   HPI Patient come in today c/o low back pain about 1 week ago and pain radiating around to te front on both sides. Rates back pain 6-7/10. Describes pain as constant achy pain. Sitting helps back pain some. Bending increases pain. She noticed a foul odor to her urine last night. She does have urgency but no frequency.   Review of Systems  Constitutional: Negative.   Respiratory: Negative.   Cardiovascular: Negative.   Genitourinary: Positive for urgency. Negative for dysuria, flank pain and frequency.  Musculoskeletal: Positive for back pain.  Neurological: Negative.   Psychiatric/Behavioral: Negative.   All other systems reviewed and are negative.      Objective:   Physical Exam Constitutional:      Appearance: Normal appearance.  Cardiovascular:     Rate and Rhythm: Normal rate and regular rhythm.     Heart sounds: Normal heart sounds.  Pulmonary:     Breath sounds: Normal breath sounds.  Musculoskeletal:        General: No tenderness.     Comments: FROM of lumbar spine without increase in pain (-) SLR on right (+) mildly SLR on left at 90 degrees Motor , strength and sensation distally intact  Skin:    General: Skin is warm.  Neurological:     General: No focal deficit present.     Mental Status: She is alert.  Psychiatric:        Mood and Affect: Mood normal.        Behavior: Behavior normal.    BP 98/63   Pulse 69   Temp (!) 96.2 F (35.7 C) (Temporal)   Resp 20   Ht 5\' 4"  (1.626 m)   Wt 140 lb (63.5 kg)   SpO2 97%   BMI 24.03 kg/m   Urine clear       Assessment & Plan:  Dominique Sandoval in today with chief complaint of Back Pain and foul smelling urine   1. Acute midline low back pain without sciatica Moist heat No bending or stooping Sedation precautions - cyclobenzaprine (FLEXERIL) 10 MG  tablet; Take 1 tablet (10 mg total) by mouth 3 (three) times daily as needed for muscle spasms.  Dispense: 30 tablet; Refill: 1 - naproxen (NAPROSYN) 500 MG tablet; Take 1 tablet (500 mg total) by mouth 2 (two) times daily with a meal.  Dispense: 60 tablet; Refill: 1  2. Foul smelling urine Force fluids - Urinalysis, Complete    The above assessment and management plan was discussed with the patient. The patient verbalized understanding of and has agreed to the management plan. Patient is aware to call the clinic if symptoms persist or worsen. Patient is aware when to return to the clinic for a follow-up visit. Patient educated on when it is appropriate to go to the emergency department.   Mary-Margaret Hassell Done, FNP

## 2019-09-19 ENCOUNTER — Other Ambulatory Visit: Payer: Self-pay

## 2019-09-19 ENCOUNTER — Ambulatory Visit
Admission: EM | Admit: 2019-09-19 | Discharge: 2019-09-19 | Disposition: A | Discharge status: Home / Self Care | Payer: No Typology Code available for payment source | Attending: Emergency Medicine | Admitting: Emergency Medicine

## 2019-09-19 DIAGNOSIS — J0100 Acute maxillary sinusitis, unspecified: Secondary | ICD-10-CM | POA: Diagnosis not present

## 2019-09-19 MED ORDER — AMOXICILLIN-POT CLAVULANATE 875-125 MG PO TABS: 1.0000 | ORAL_TABLET | Freq: Two times a day (BID) | ORAL | 0 refills | Status: DC

## 2019-09-19 MED ORDER — FLUTICASONE PROPIONATE 50 MCG/ACT NA SUSP: 1.0000 | Freq: Every day | NASAL | 0 refills | Status: DC

## 2019-09-19 NOTE — ED Provider Notes (Addendum)
[facility]    CSN: [id number] Arrival date & time: 09/19/19  1905      History   Chief Complaint Chief Complaint  Patient presents with  . Facial Pain    HPI Dominique Sandoval is a 54 y.o. female.   who presents to the urgent care with a complaint of sinus pressure and sinus pain and irritation in her nose for the past week.  Denies sick exposure to HSV, COVID, flu or strep.  Denies recent travel.  Denies aggravating or alleviating symptoms.  Denies previous COVID infection.   Denies fever, chills, fatigue, nasal congestion, rhinorrhea, sore throat, cough, SOB, wheezing, chest pain, nausea, vomiting, changes in bowel or bladder habits.    The history is provided by the patient. No language interpreter was used.    Past Medical History:  Diagnosis Date  . Carpal tunnel syndrome of right wrist   . Mass of right wrist   . Wears contact lenses     Patient Active Problem List   Diagnosis Date Noted  . Seborrhea 04/19/2018  . Anxiety 04/19/2018  . Generalized abdominal pain 04/19/2018  . Constipation 03/28/2017  . Gastroesophageal reflux disease without esophagitis 03/28/2017  . Primary insomnia 09/19/2016  . BENIGN NEOPLASM OF ADRENAL GLAND 10/07/2007    Past Surgical History:  Procedure Laterality Date  . CARPAL TUNNEL RELEASE Right 02/02/2014   Procedure: RIGHT CARPAL TUNNEL RELEASE;  Surgeon: Linna Hoff, MD;  Location: [facility];  Service: Orthopedics;  Laterality: Right;  . CESAREAN SECTION  may 2000  . LAPAROSCOPIC ASSISTED VAGINAL HYSTERECTOMY  04-07-2002   w/  left salpingectomy  . MASS EXCISION Right 02/02/2014   Procedure: RIGHT WRIST DEEP  MASS EXCISION ;  Surgeon: Linna Hoff, MD;  Location: [facility];  Service: Orthopedics;  Laterality: Right;  . TONSILLECTOMY  age 81    OB History   No obstetric history on file.      Home Medications    Prior to Admission medications   Medication Sig Start Date End  Date Taking? Authorizing Provider  ALPRAZolam Duanne Moron) 0.25 MG tablet Take 1 tablet by mouth twice daily as needed for anxiety 04/26/19   Terald Sleeper, PA-C  amoxicillin-clavulanate (AUGMENTIN) 875-125 MG tablet Take 1 tablet by mouth every 12 (twelve) hours. 09/19/19   Broedy Osbourne, Darrelyn Hillock, FNP  cyclobenzaprine (FLEXERIL) 10 MG tablet Take 1 tablet (10 mg total) by mouth 3 (three) times daily as needed for muscle spasms. 08/02/19   Hassell Done, Mary-Margaret, FNP  escitalopram (LEXAPRO) 10 MG tablet Take 1 tablet (10 mg total) by mouth daily. 04/19/18   Terald Sleeper, PA-C  fluticasone (FLONASE) 50 MCG/ACT nasal spray Place 1 spray into both nostrils daily for 14 days. 09/19/19 10/03/19  Yamaris Cummings, Darrelyn Hillock, FNP  naproxen (NAPROSYN) 500 MG tablet Take 1 tablet (500 mg total) by mouth 2 (two) times daily with a meal. 08/02/19   Chevis Pretty, FNP    Family History History reviewed. No pertinent family history.  Social History Social History   Tobacco Use  . Smoking status: Former Smoker    Packs/day: 0.25    Years: 15.00    Pack years: 3.75    Types: Cigarettes    Quit date: 01/27/2004    Years since quitting: 15.6  . Smokeless tobacco: Never Used  Substance Use Topics  . Alcohol use: Yes    Comment: occasional  . Drug use: No     Allergies  Ciprofloxacin and Robitussin (alcohol free) [guaifenesin]   Review of Systems Review of Systems  Constitutional: Negative.   HENT: Positive for sinus pressure and sinus pain.   Respiratory: Negative.   Cardiovascular: Negative.   All other systems reviewed and are negative.    Physical Exam Triage Vital Signs ED Triage Vitals [09/19/19 1917]  Enc Vitals Group     BP      Pulse      Resp      Temp      Temp src      SpO2      Weight      Height      Head Circumference      Peak Flow      Pain Score 5     Pain Loc      Pain Edu?      Excl. in [place]?    No data found.  Updated Vital Signs BP 117/80   Pulse 85   Temp 98.6  F (37 C)   Resp 18   SpO2 94%   Visual Acuity Right Eye Distance:   Left Eye Distance:   Bilateral Distance:    Right Eye Near:   Left Eye Near:    Bilateral Near:     Physical Exam Vitals and nursing note reviewed.  Constitutional:      General: She is not in acute distress.    Appearance: Normal appearance. She is normal weight. She is not ill-appearing or toxic-appearing.  HENT:     Head: Normocephalic and atraumatic.     Right Ear: Tympanic membrane, ear canal and external ear normal. There is no impacted cerumen.     Left Ear: Tympanic membrane, ear canal and external ear normal. There is no impacted cerumen.     Nose: No congestion.     Right Sinus: Maxillary sinus tenderness present.     Left Sinus: Maxillary sinus tenderness present.     Mouth/Throat:     Mouth: Mucous membranes are moist.     Pharynx: Oropharynx is clear. No oropharyngeal exudate.  Cardiovascular:     Rate and Rhythm: Normal rate and regular rhythm.     Pulses: Normal pulses.     Heart sounds: Normal heart sounds. No murmur. No friction rub. No gallop.   Pulmonary:     Effort: Pulmonary effort is normal. No respiratory distress.     Breath sounds: Normal breath sounds. No stridor. No wheezing or rales.  Chest:     Chest wall: No tenderness.  Neurological:     Mental Status: She is alert and oriented to person, place, and time.      UC Treatments / Results  Labs (all labs ordered are listed, but only abnormal results are displayed) Labs Reviewed - No data to display  EKG   Radiology No results found.  Procedures Procedures (including critical care time)  Medications Ordered in UC Medications - No data to display  Initial Impression / Assessment and Plan / UC Course  I have reviewed the triage vital signs and the nursing notes.  Pertinent labs & imaging results that were available during my care of the patient were reviewed by me and considered in my medical decision making (see  chart for details).    Patient is stable for discharge. Advised patient to take medication as prescribed Follow-up with primary care Return for worsening symptoms  Final Clinical Impressions(s) / UC Diagnoses   Final diagnoses:  Acute maxillary sinusitis, recurrence not  specified     Discharge Instructions     Rest and push fluids Augmentin prescribed.  Take as directed and to completion Flonase was prescribed Continue with OTC ibuprofen/tylenol as needed for pain Follow up with PCP or [facility] if symptoms persists Return or go to the ED if you have any new or worsening symptoms such as fever, chills, worsening sinus pain/pressure, cough, sore throat, chest pain, shortness of breath, abdominal pain, changes in bowel or bladder habits, etc...     ED Prescriptions    Medication Sig Dispense Auth. Provider   amoxicillin-clavulanate (AUGMENTIN) 875-125 MG tablet Take 1 tablet by mouth every 12 (twelve) hours. 14 tablet Aariyah Sampey S, FNP   fluticasone (FLONASE) 50 MCG/ACT nasal spray Place 1 spray into both nostrils daily for 14 days. 16 g Emerson Monte, FNP     PDMP not reviewed this encounter.   Emerson Monte, FNP 09/19/19 1933    Emerson Monte, FNP 09/19/19 1934

## 2019-09-19 NOTE — Discharge Instructions (Addendum)
Rest and push fluids Augmentin prescribed.  Take as directed and to completion Flonase was prescribed Continue with OTC ibuprofen/tylenol as needed for pain Follow up with PCP or [facility] if symptoms persists Return or go to the ED if you have any new or worsening symptoms such as fever, chills, worsening sinus pain/pressure, cough, sore throat, chest pain, shortness of breath, abdominal pain, changes in bowel or bladder habits, etc..Dominique Sandoval

## 2019-09-19 NOTE — ED Triage Notes (Signed)
Pt has had sores develop in nose over past week

## 2019-10-03 ENCOUNTER — Ambulatory Visit: Payer: No Typology Code available for payment source | Admitting: Family Medicine

## 2019-10-03 ENCOUNTER — Ambulatory Visit: Payer: PRIVATE HEALTH INSURANCE | Admitting: Physician Assistant

## 2019-10-04 ENCOUNTER — Encounter: Payer: Self-pay | Admitting: Family Medicine

## 2019-10-11 ENCOUNTER — Ambulatory Visit: Payer: No Typology Code available for payment source | Admitting: Family Medicine

## 2019-10-11 ENCOUNTER — Encounter: Payer: Self-pay | Admitting: Family Medicine

## 2019-10-11 ENCOUNTER — Other Ambulatory Visit: Payer: Self-pay

## 2019-10-11 VITALS — BP 111/76 | HR 76 | Temp 97.9°F | Ht 64.0 in | Wt 135.9 lb

## 2019-10-11 DIAGNOSIS — Z0000 Encounter for general adult medical examination without abnormal findings: Secondary | ICD-10-CM

## 2019-10-11 DIAGNOSIS — Z13220 Encounter for screening for lipoid disorders: Secondary | ICD-10-CM

## 2019-10-11 DIAGNOSIS — F419 Anxiety disorder, unspecified: Secondary | ICD-10-CM

## 2019-10-11 DIAGNOSIS — M8589 Other specified disorders of bone density and structure, multiple sites: Secondary | ICD-10-CM | POA: Diagnosis not present

## 2019-10-11 DIAGNOSIS — K219 Gastro-esophageal reflux disease without esophagitis: Secondary | ICD-10-CM | POA: Diagnosis not present

## 2019-10-11 DIAGNOSIS — Z130 Encounter for screening for diseases of the blood and blood-forming organs and certain disorders involving the immune mechanism: Secondary | ICD-10-CM

## 2019-10-11 DIAGNOSIS — G479 Sleep disorder, unspecified: Secondary | ICD-10-CM | POA: Diagnosis not present

## 2019-10-11 DIAGNOSIS — Z23 Encounter for immunization: Secondary | ICD-10-CM

## 2019-10-11 MED ORDER — SHINGRIX 50 MCG/0.5ML IM SUSR: 0.5000 mL | Freq: Once | INTRAMUSCULAR | 0 refills | Status: AC

## 2019-10-11 MED ORDER — OMEPRAZOLE 20 MG PO CPDR: 20.0000 mg | DELAYED_RELEASE_CAPSULE | Freq: Two times a day (BID) | ORAL | 2 refills | Status: DC

## 2019-10-11 MED ORDER — ESCITALOPRAM OXALATE 10 MG PO TABS: 10.0000 mg | ORAL_TABLET | Freq: Every day | ORAL | 2 refills | Status: DC

## 2019-10-11 NOTE — Patient Instructions (Signed)
Osteopenia  Osteopenia is a loss of thickness (density) inside of the bones. Another name for osteopenia is low bone mass. Mild osteopenia is a normal part of aging. It is not a disease, and it does not cause symptoms. However, if you have osteopenia and continue to lose bone mass, you could develop a condition that causes the bones to become thin and break more easily (osteoporosis). You may also lose some height, have back pain, and have a stooped posture. Although osteopenia is not a disease, making changes to your lifestyle and diet can help to prevent osteopenia from developing into osteoporosis. What are the causes? Osteopenia is caused by loss of calcium in the bones.  Bones are constantly changing. Old bone cells are continually being replaced with new bone cells. This process builds new bone. The mineral calcium is needed to build new bone and maintain bone density. Bone density is usually highest around age 35. After that, most people's bodies cannot replace all the bone they have lost with new bone. What increases the risk? You are more likely to develop this condition if:  You are older than age 50.  You are a woman who went through menopause early.  You have a long illness that keeps you in bed.  You do not get enough exercise.  You lack certain nutrients (malnutrition).  You have an overactive thyroid gland (hyperthyroidism).  You smoke.  You drink a lot of alcohol.  You are taking medicines that weaken the bones, such as steroids. What are the signs or symptoms? This condition does not cause any symptoms. You may have a slightly higher risk for bone breaks (fractures), so getting fractures more easily than normal may be an indication of osteopenia. How is this diagnosed? Your health care provider can diagnose this condition with a special type of X-ray exam that measures bone density (dual-energy X-ray absorptiometry, DEXA). This test can measure bone density in your  hips, spine, and wrists. Osteopenia has no symptoms, so this condition is usually diagnosed after a routine bone density screening test is done for osteoporosis. This routine screening is usually done for:  Women who are age 65 or older.  Men who are age 70 or older. If you have risk factors for osteopenia, you may have the screening test at an earlier age. How is this treated? Making dietary and lifestyle changes can lower your risk for osteoporosis. If you have severe osteopenia that is close to becoming osteoporosis, your health care provider may prescribe medicines and dietary supplements such as calcium and vitamin D. These supplements help to rebuild bone density. Follow these instructions at home:   Take over-the-counter and prescription medicines only as told by your health care provider. These include vitamins and supplements.  Eat a diet that is high in calcium and vitamin D. ? Calcium is found in dairy products, beans, salmon, and leafy green vegetables like spinach and broccoli. ? Look for foods that have vitamin D and calcium added to them (fortified foods), such as orange juice, cereal, and bread.  Do 30 or more minutes of a weight-bearing exercise every day, such as walking, jogging, or playing a sport. These types of exercises strengthen the bones.  Take precautions at home to lower your risk of falling, such as: ? Keeping rooms well-lit and free of clutter, such as cords. ? Installing safety rails on stairs. ? Using rubber mats in the bathroom or other areas that are often wet or slippery.  Do not use   any products that contain nicotine or tobacco, such as cigarettes and e-cigarettes. If you need help quitting, ask your health care provider.  Avoid alcohol or limit alcohol intake to no more than 1 drink a day for nonpregnant women and 2 drinks a day for men. One drink equals 12 oz of beer, 5 oz of wine, or 1 oz of hard liquor.  Keep all follow-up visits as told by your  health care provider. This is important. Contact a health care provider if:  You have not had a bone density screening for osteoporosis and you are: ? A woman, age 65 or older. ? A man, age 70 or older.  You are a postmenopausal woman who has not had a bone density screening for osteoporosis.  You are older than age 50 and you want to know if you should have bone density screening for osteoporosis. Summary  Osteopenia is a loss of thickness (density) inside of the bones. Another name for osteopenia is low bone mass.  Osteopenia is not a disease, but it may increase your risk for a condition that causes the bones to become thin and break more easily (osteoporosis).  You may be at risk for osteopenia if you are older than age 50 or if you are a woman who went through early menopause.  Osteopenia does not cause any symptoms, but it can be diagnosed with a bone density screening test.  Dietary and lifestyle changes are the first treatment for osteopenia. These may lower your risk for osteoporosis. This information is not intended to replace advice given to you by your health care provider. Make sure you discuss any questions you have with your health care provider. Document Revised: 05/29/2017 Document Reviewed: 03/25/2017 Elsevier Patient Education  2020 Elsevier Inc.  

## 2019-10-11 NOTE — Progress Notes (Signed)
Assessment & Plan:  1. Anxiety - Well controlled on current regimen.  - escitalopram (LEXAPRO) 10 MG tablet; Take 1 tablet (10 mg total) by mouth daily.  Dispense: 90 tablet; Refill: 2 - CMP14+EGFR  2. Difficulty sleeping - Patient to continue Ambien 5 mg QHS to help with sleep. Discussed Xanax is not appropriate for sleep.   3. Osteopenia of multiple sites - Patient to take over the counter Calcium 1,200 mg and Vitamin  D3 2000 IU once daily. Encouraged doing weightbearing exercise like walking, jogging, dancing, etc. Education provided on osteopenia.   4. Gastroesophageal reflux disease without esophagitis - Well controlled on current regimen.  - CMP14+EGFR  5. Immunization due - SHINGRIX injection; Inject 0.5 mLs into the muscle once for 1 dose.  Dispense: 0.5 mL; Refill: 0  6. Screening for lipid disorders - Lipid panel  7. Screening for deficiency anemia - CBC with Differential/Platelet  8. Healthcare maintenance - Shingrix sent to pharmacy. Record release signed for pap smear and colonosocpy. DEXA and mammogram records that patient brought with her will be scanned in. She declined TDAP and HIV screening.    Return in about 6 months (around 04/11/2020) for follow-up of chronic medication conditions.  Hendricks Limes, MSN, APRN, FNP-C [facility]  Subjective:    Patient ID: Dominique Sandoval, female    DOB: Dec 03, 1965, 54 y.o.   MRN: [id number]  Patient Care Team: Loman Brooklyn, FNP as PCP - General (Family Medicine)   Chief Complaint:  Chief Complaint  Patient presents with  . Establish Care    Dominique Sandoval  . Anxiety    check up     HPI: [name] is a 54 y.o. female presenting on 10/11/2019 for Establish Care (Dominique Sandoval) and Anxiety (check up )  Patient would like to discuss her bone density [facility] [place].  She had a T score of -2.3 indicating osteopenia.  She reports her physician  wants to start her on Evista to improve her bone density.  She is not sure she wants to start this medication as she has read about the side effects.  She also feels she did herself a disservice over the past couple of years.  In 2018 she stopped eating gluten and dairy.  In 2019 she stopped eating nuts.  She reports she was taking calcium, but apparently only half of what she thought she was taking.  She would like to try taking calcium and vitamin D routinely before adding the Evista.  Patient has been taking Xanax 0.25 mg at bedtime to help with sleep.  She is having trouble sleeping as when she lays down at night all she sees is her mother.  She reports her father killed her mother.  He is in prison and will hopefully be going through trial soon now that [place] is back open.  She has not taken any Xanax since Wednesday of last week.  Her OB/GYN gave her Ambien 5 mg to take at bedtime.  She has been taking this for the past few days and reports it does work well.   Social history:  Relevant past medical, surgical, family and social history reviewed and updated as indicated. Interim medical history since our last visit reviewed.  Allergies and medications reviewed and updated.  DATA REVIEWED: CHART IN EPIC  ROS: Negative unless specifically indicated above in HPI.    Current Outpatient Medications:  .  escitalopram (LEXAPRO)  10 MG tablet, Take 1 tablet (10 mg total) by mouth daily., Disp: 90 tablet, Rfl: 2 .  omeprazole (PRILOSEC) 20 MG capsule, Take 1 capsule (20 mg total) by mouth 2 (two) times daily before a meal., Disp: 180 capsule, Rfl: 2 .  fluticasone (FLONASE) 50 MCG/ACT nasal spray, Place 1 spray into both nostrils daily for 14 days., Disp: 16 g, Rfl: 0   Allergies  Allergen Reactions  . Ciprofloxacin Hives  . Robitussin (Alcohol Free) [Guaifenesin] Hives   Past Medical History:  Diagnosis Date  . Carpal tunnel syndrome of right wrist   . Mass of right wrist   . Wears  contact lenses     Past Surgical History:  Procedure Laterality Date  . CARPAL TUNNEL RELEASE Right 02/02/2014   Procedure: RIGHT CARPAL TUNNEL RELEASE;  Surgeon: Linna Hoff, MD;  Location: [facility];  Service: Orthopedics;  Laterality: Right;  . CESAREAN SECTION  may 2000  . LAPAROSCOPIC ASSISTED VAGINAL HYSTERECTOMY  04-07-2002   w/  left salpingectomy  . MASS EXCISION Right 02/02/2014   Procedure: RIGHT WRIST DEEP  MASS EXCISION ;  Surgeon: Linna Hoff, MD;  Location: [facility];  Service: Orthopedics;  Laterality: Right;  . TONSILLECTOMY  age 29    Social History   Socioeconomic History  . Marital status: Married    Spouse name: Not on file  . Number of children: Not on file  . Years of education: Not on file  . Highest education level: Not on file  Occupational History  . Not on file  Tobacco Use  . Smoking status: Former Smoker    Packs/day: 0.25    Years: 15.00    Pack years: 3.75    Types: Cigarettes    Quit date: 01/27/2004    Years since quitting: 15.7  . Smokeless tobacco: Never Used  Substance and Sexual Activity  . Alcohol use: Yes    Comment: occasional  . Drug use: No  . Sexual activity: Not on file  Other Topics Concern  . Not on file  Social History Narrative  . Not on file   Social Determinants of Health   Financial Resource Strain:   . Difficulty of Paying Living Expenses:   Food Insecurity:   . Worried About Charity fundraiser in the Last Year:   . Arboriculturist in the Last Year:   Transportation Needs:   . Film/video editor (Medical):   Marland Kitchen Lack of Transportation (Non-Medical):   Physical Activity:   . Days of Exercise per Week:   . Minutes of Exercise per Session:   Stress:   . Feeling of Stress :   Social Connections:   . Frequency of Communication with Friends and Family:   . Frequency of Social Gatherings with Friends and Family:   . Attends Religious Services:   . Active Member of Clubs or  Organizations:   . Attends Archivist Meetings:   Marland Kitchen Marital Status:   Intimate Partner Violence:   . Fear of Current or Ex-Partner:   . Emotionally Abused:   Marland Kitchen Physically Abused:   . Sexually Abused:         Objective:    BP 111/76   Pulse 76   Temp 97.9 F (36.6 C) (Temporal)   Ht 5' 4"  (1.626 m)   Wt 135 lb 14.4 oz (61.6 kg)   SpO2 95%   BMI 23.33 kg/m   Wt Readings from Last  3 Encounters:  10/11/19 135 lb 14.4 oz (61.6 kg)  08/02/19 140 lb (63.5 kg)  04/19/18 138 lb (62.6 kg)    Physical Exam Vitals reviewed.  Constitutional:      General: She is not in acute distress.    Appearance: Normal appearance. She is normal weight. She is not ill-appearing, toxic-appearing or diaphoretic.  HENT:     Head: Normocephalic and atraumatic.  Eyes:     General: No scleral icterus.       Right eye: No discharge.        Left eye: No discharge.     Conjunctiva/sclera: Conjunctivae normal.  Cardiovascular:     Rate and Rhythm: Normal rate and regular rhythm.     Heart sounds: Normal heart sounds. No murmur. No friction rub. No gallop.   Pulmonary:     Effort: Pulmonary effort is normal. No respiratory distress.     Breath sounds: Normal breath sounds. No stridor. No wheezing, rhonchi or rales.  Musculoskeletal:        General: Normal range of motion.     Cervical back: Normal range of motion.  Skin:    General: Skin is warm and dry.     Capillary Refill: Capillary refill takes less than 2 seconds.  Neurological:     General: No focal deficit present.     Mental Status: She is alert and oriented to person, place, and time. Mental status is at baseline.  Psychiatric:        Mood and Affect: Mood normal.        Behavior: Behavior normal.        Thought Content: Thought content normal.        Judgment: Judgment normal.     Lab Results  Component Value Date   TSH 1.550 09/08/2017   Lab Results  Component Value Date   WBC 7.8 10/11/2019   HGB 12.3 10/11/2019    HCT 35.5 10/11/2019   MCV 90 10/11/2019   PLT 204 10/11/2019   Lab Results  Component Value Date   NA 140 10/11/2019   K 4.1 10/11/2019   CO2 25 10/11/2019   GLUCOSE 71 10/11/2019   BUN 16 10/11/2019   CREATININE 0.72 10/11/2019   BILITOT 0.3 10/11/2019   ALKPHOS 58 10/11/2019   AST 24 10/11/2019   ALT 19 10/11/2019   PROT 6.5 10/11/2019   ALBUMIN 4.6 10/11/2019   CALCIUM 9.7 10/11/2019   Lab Results  Component Value Date   CHOL 137 10/11/2019   Lab Results  Component Value Date   HDL 60 10/11/2019   Lab Results  Component Value Date   LDLCALC 65 10/11/2019   Lab Results  Component Value Date   TRIG 53 10/11/2019   Lab Results  Component Value Date   CHOLHDL 2.3 10/11/2019   No results found for: HGBA1C

## 2019-10-13 LAB — CBC WITH DIFFERENTIAL/PLATELET
Basophils Absolute: 0.1 10*3/uL (ref 0.0–0.2)
Basos: 1 %
EOS (ABSOLUTE): 0.2 10*3/uL (ref 0.0–0.4)
Eos: 2 %
Hematocrit: 35.5 % (ref 34.0–46.6)
Hemoglobin: 12.3 g/dL (ref 11.1–15.9)
Immature Grans (Abs): 0 10*3/uL (ref 0.0–0.1)
Immature Granulocytes: 0 %
Lymphocytes Absolute: 3.4 10*3/uL — ABNORMAL HIGH (ref 0.7–3.1)
Lymphs: 44 %
MCH: 31.1 pg (ref 26.6–33.0)
MCHC: 34.6 g/dL (ref 31.5–35.7)
MCV: 90 fL (ref 79–97)
Monocytes Absolute: 0.4 10*3/uL (ref 0.1–0.9)
Monocytes: 5 %
Neutrophils Absolute: 3.7 10*3/uL (ref 1.4–7.0)
Neutrophils: 48 %
Platelets: 204 10*3/uL (ref 150–450)
RBC: 3.95 x10E6/uL (ref 3.77–5.28)
RDW: 11.6 % — ABNORMAL LOW (ref 11.7–15.4)
WBC: 7.8 10*3/uL (ref 3.4–10.8)

## 2019-10-13 LAB — CMP14+EGFR
ALT: 19 IU/L (ref 0–32)
AST: 24 IU/L (ref 0–40)
Albumin/Globulin Ratio: 2.4 — ABNORMAL HIGH (ref 1.2–2.2)
Albumin: 4.6 g/dL (ref 3.8–4.9)
Alkaline Phosphatase: 58 IU/L (ref 39–117)
BUN/Creatinine Ratio: 22 (ref 9–23)
BUN: 16 mg/dL (ref 6–24)
Bilirubin Total: 0.3 mg/dL (ref 0.0–1.2)
CO2: 25 mmol/L (ref 20–29)
Calcium: 9.7 mg/dL (ref 8.7–10.2)
Chloride: 101 mmol/L (ref 96–106)
Creatinine, Ser: 0.72 mg/dL (ref 0.57–1.00)
GFR calc Af Amer: 111 mL/min/{1.73_m2} (ref >59)
GFR calc non Af Amer: 97 mL/min/{1.73_m2} (ref >59)
Globulin, Total: 1.9 g/dL (ref 1.5–4.5)
Glucose: 71 mg/dL (ref 65–99)
Potassium: 4.1 mmol/L (ref 3.5–5.2)
Sodium: 140 mmol/L (ref 134–144)
Total Protein: 6.5 g/dL (ref 6.0–8.5)

## 2019-10-13 LAB — LIPID PANEL
Chol/HDL Ratio: 2.3 ratio (ref 0.0–4.4)
Cholesterol, Total: 137 mg/dL (ref 100–199)
HDL: 60 mg/dL (ref >39)
LDL Chol Calc (NIH): 65 mg/dL (ref 0–99)
Triglycerides: 53 mg/dL (ref 0–149)
VLDL Cholesterol Cal: 12 mg/dL (ref 5–40)

## 2019-10-15 ENCOUNTER — Encounter: Payer: Self-pay | Admitting: Family Medicine

## 2019-10-15 DIAGNOSIS — G479 Sleep disorder, unspecified: Secondary | ICD-10-CM | POA: Insufficient documentation

## 2019-10-15 DIAGNOSIS — M8589 Other specified disorders of bone density and structure, multiple sites: Secondary | ICD-10-CM | POA: Insufficient documentation

## 2019-11-07 ENCOUNTER — Telehealth: Payer: Self-pay | Admitting: Family Medicine

## 2019-11-07 NOTE — Telephone Encounter (Signed)
Please have her add a stool softener. If constipation is still an issue she can decrease her calcium and increase calcium in her diet.

## 2019-11-07 NOTE — Telephone Encounter (Signed)
Patient states that she has only been taking 1/2 tablet of her 1200 calcium pill.  On her visit with Britney on 10/11/19 she increased her calcium to taking a whole pill.  States ever since increasing it she has been having constipation.  Please advise - covering PCP.

## 2019-11-07 NOTE — Telephone Encounter (Signed)
Pt called to let Dominique Sandoval know that she is having issues with going to the bathroom ever since taking the increase dosage with her Calcium Rx. Needs advice.

## 2019-11-07 NOTE — Telephone Encounter (Signed)
Patient aware and verbalizes understanding. 

## 2019-11-17 ENCOUNTER — Ambulatory Visit: Payer: No Typology Code available for payment source | Admitting: Family Medicine

## 2019-11-17 ENCOUNTER — Encounter: Payer: Self-pay | Admitting: Family Medicine

## 2019-11-17 ENCOUNTER — Other Ambulatory Visit: Payer: Self-pay

## 2019-11-17 VITALS — BP 104/70 | HR 74 | Temp 97.7°F | Ht 64.0 in | Wt 130.6 lb

## 2019-11-17 DIAGNOSIS — K649 Unspecified hemorrhoids: Secondary | ICD-10-CM | POA: Diagnosis not present

## 2019-11-17 DIAGNOSIS — K5903 Drug induced constipation: Secondary | ICD-10-CM | POA: Diagnosis not present

## 2019-11-17 NOTE — Patient Instructions (Signed)
Calcium citrate NOT calcium carbonate  Miralax 1 capful in 6-8 oz beverage of choice once daily as needed.    Hemorrhoids Hemorrhoids are swollen veins in and around the rectum or anus. There are two types of hemorrhoids:  Internal hemorrhoids. These occur in the veins that are just inside the rectum. They may poke through to the outside and become irritated and painful.  External hemorrhoids. These occur in the veins that are outside the anus and can be felt as a painful swelling or hard lump near the anus. Most hemorrhoids do not cause serious problems, and they can be managed with home treatments such as diet and lifestyle changes. If home treatments do not help the symptoms, procedures can be done to shrink or remove the hemorrhoids. What are the causes? This condition is caused by increased pressure in the anal area. This pressure may result from various things, including:  Constipation.  Straining to have a bowel movement.  Diarrhea.  Pregnancy.  Obesity.  Sitting for long periods of time.  Heavy lifting or other activity that causes you to strain.  Anal sex.  Riding a bike for a long period of time. What are the signs or symptoms? Symptoms of this condition include:  Pain.  Anal itching or irritation.  Rectal bleeding.  Leakage of stool (feces).  Anal swelling.  One or more lumps around the anus. How is this diagnosed? This condition can often be diagnosed through a visual exam. Other exams or tests may also be done, such as:  An exam that involves feeling the rectal area with a gloved hand (digital rectal exam).  An exam of the anal canal that is done using a small tube (anoscope).  A blood test, if you have lost a significant amount of blood.  A test to look inside the colon using a flexible tube with a camera on the end (sigmoidoscopy or colonoscopy). How is this treated? This condition can usually be treated at home. However, various procedures  may be done if dietary changes, lifestyle changes, and other home treatments do not help your symptoms. These procedures can help make the hemorrhoids smaller or remove them completely. Some of these procedures involve surgery, and others do not. Common procedures include:  Rubber band ligation. Rubber bands are placed at the base of the hemorrhoids to cut off their blood supply.  Sclerotherapy. Medicine is injected into the hemorrhoids to shrink them.  Infrared coagulation. A type of light energy is used to get rid of the hemorrhoids.  Hemorrhoidectomy surgery. The hemorrhoids are surgically removed, and the veins that supply them are tied off.  Stapled hemorrhoidopexy surgery. The surgeon staples the base of the hemorrhoid to the rectal wall. Follow these instructions at home: Eating and drinking   Eat foods that have a lot of fiber in them, such as whole grains, beans, nuts, fruits, and vegetables.  Ask your health care provider about taking products that have added fiber (fiber supplements).  Reduce the amount of fat in your diet. You can do this by eating low-fat dairy products, eating less red meat, and avoiding processed foods.  Drink enough fluid to keep your urine pale yellow. Managing pain and swelling   Take warm sitz baths for 20 minutes, 3-4 times a day to ease pain and discomfort. You may do this in a bathtub or using a portable sitz bath that fits over the toilet.  If directed, apply ice to the affected area. Using ice packs between sitz baths  may be helpful. ? Put ice in a plastic bag. ? Place a towel between your skin and the bag. ? Leave the ice on for 20 minutes, 2-3 times a day. General instructions  Take over-the-counter and prescription medicines only as told by your health care provider.  Use medicated creams or suppositories as told.  Get regular exercise. Ask your health care provider how much and what kind of exercise is best for you. In general, you  should do moderate exercise for at least 30 minutes on most days of the week (150 minutes each week). This can include activities such as walking, biking, or yoga.  Go to the bathroom when you have the urge to have a bowel movement. Do not wait.  Avoid straining to have bowel movements.  Keep the anal area dry and clean. Use wet toilet paper or moist towelettes after a bowel movement.  Do not sit on the toilet for long periods of time. This increases blood pooling and pain.  Keep all follow-up visits as told by your health care provider. This is important. Contact a health care provider if you have:  Increasing pain and swelling that are not controlled by treatment or medicine.  Difficulty having a bowel movement, or you are unable to have a bowel movement.  Pain or inflammation outside the area of the hemorrhoids. Get help right away if you have:  Uncontrolled bleeding from your rectum. Summary  Hemorrhoids are swollen veins in and around the rectum or anus.  Most hemorrhoids can be managed with home treatments such as diet and lifestyle changes.  Taking warm sitz baths can help ease pain and discomfort.  In severe cases, procedures or surgery can be done to shrink or remove the hemorrhoids. This information is not intended to replace advice given to you by your health care provider. Make sure you discuss any questions you have with your health care provider. Document Revised: 11/12/2018 Document Reviewed: 11/05/2017 Elsevier Patient Education  [place].   Constipation, Adult Constipation is when a person:  Poops (has a bowel movement) fewer times in a week than normal.  Has a hard time pooping.  Has poop that is dry, hard, or bigger than normal. Follow these instructions at home: Eating and drinking   Eat foods that have a lot of fiber, such as: ? Fresh fruits and vegetables. ? Whole grains. ? Beans.  Eat less of foods that are high in fat, low in  fiber, or overly processed, such as: ? [place] fries. ? Hamburgers. ? Cookies. ? Candy. ? Soda.  Drink enough fluid to keep your pee (urine) clear or pale yellow. General instructions  Exercise regularly or as told by your doctor.  Go to the restroom when you feel like you need to poop. Do not hold it in.  Take over-the-counter and prescription medicines only as told by your doctor. These include any fiber supplements.  Do pelvic floor retraining exercises, such as: ? Doing deep breathing while relaxing your lower belly (abdomen). ? Relaxing your pelvic floor while pooping.  Watch your condition for any changes.  Keep all follow-up visits as told by your doctor. This is important. Contact a doctor if:  You have pain that gets worse.  You have a fever.  You have not pooped for 4 days.  You throw up (vomit).  You are not hungry.  You lose weight.  You are bleeding from the anus.  You have thin, pencil-like poop (stool). Get help right  away if:  You have a fever, and your symptoms suddenly get worse.  You leak poop or have blood in your poop.  Your belly feels hard or bigger than normal (is bloated).  You have very bad belly pain.  You feel dizzy or you faint. This information is not intended to replace advice given to you by your health care provider. Make sure you discuss any questions you have with your health care provider. Document Revised: 05/29/2017 Document Reviewed: 12/05/2015 Elsevier Patient Education  2020 [employer].

## 2019-11-17 NOTE — Progress Notes (Signed)
Assessment & Plan:  1. Drug-induced constipation - Advised to switch calcium supplement from Caltrate to Citracal.  May use Miralax 1 capful in 6-8 oz beverage of choice once daily as needed.  Education provided on constipation.  2. Hemorrhoids, unspecified hemorrhoid type - Continue Preparation H suppositories twice daily as needed.  Discussed hemorrhoids should resolve with treatment of constipation.  Education provided on hemorrhoids.  Patient advised to let me know if she experiences any further episodes of rectal bleeding after hemorrhoids and constipation are treated.   Follow up plan: Return if symptoms worsen or fail to improve.  Hendricks Limes, MSN, APRN, FNP-C [facility]  Subjective:   Patient ID: PHEBE EHLINGER, female    DOB: [birth year], 54 y.o.   MRN: [id number]  HPI: TERRINA KUN is a 54 y.o. female presenting on 11/17/2019 for Rectal Pain (Patient states that it has been going on a few weeks.  Patietn states she is buring in her rectum.) and Hemorrhoids  Patient has been having trouble with rectal pain and hemorrhoids for the past few weeks.  She has never had hemorrhoids before.  She has been relieving her symptoms of itching and burning with Preparation H suppositories twice daily.  She has also been constipated for the past few weeks, which is also new for her.  This started after she started taking calcium and vitamin D supplements.  She has also had 2 episodes of rectal bleeding that did not occur during a bowel movement.  Patient is scheduled for repeat colonoscopy February 2022 with Dr. Thana Farr.    ROS: Negative unless specifically indicated above in HPI.   Relevant past medical history reviewed and updated as indicated.   Allergies and medications reviewed and updated.   Current Outpatient Medications:  .  escitalopram (LEXAPRO) 10 MG tablet, Take 1 tablet (10 mg total) by mouth daily., Disp: 90 tablet, Rfl: 2 .  omeprazole (PRILOSEC)  20 MG capsule, Take 1 capsule (20 mg total) by mouth 2 (two) times daily before a meal., Disp: 180 capsule, Rfl: 2 .  zolpidem (AMBIEN) 5 MG tablet, Take 5 mg by mouth at bedtime as needed., Disp: , Rfl:  .  fluticasone (FLONASE) 50 MCG/ACT nasal spray, Place 1 spray into both nostrils daily for 14 days., Disp: 16 g, Rfl: 0  Allergies  Allergen Reactions  . Ciprofloxacin Hives  . Robitussin (Alcohol Free) [Guaifenesin] Hives    Objective:   BP 104/70   Pulse 74   Temp 97.7 F (36.5 C) (Temporal)   Ht 5\' 4"  (1.626 m)   Wt 130 lb 9.6 oz (59.2 kg)   SpO2 100%   BMI 22.42 kg/m    Physical Exam Vitals reviewed.  Constitutional:      General: She is not in acute distress.    Appearance: Normal appearance. She is normal weight. She is not ill-appearing, toxic-appearing or diaphoretic.  HENT:     Head: Normocephalic and atraumatic.  Eyes:     General: No scleral icterus.       Right eye: No discharge.        Left eye: No discharge.     Conjunctiva/sclera: Conjunctivae normal.  Cardiovascular:     Rate and Rhythm: Normal rate and regular rhythm.     Heart sounds: Normal heart sounds. No murmur. No friction rub. No gallop.   Pulmonary:     Effort: Pulmonary effort is normal. No respiratory distress.     Breath sounds: Normal breath sounds.  No stridor. No wheezing, rhonchi or rales.  Musculoskeletal:        General: Normal range of motion.     Cervical back: Normal range of motion.  Skin:    General: Skin is warm and dry.     Capillary Refill: Capillary refill takes less than 2 seconds.  Neurological:     General: No focal deficit present.     Mental Status: She is alert and oriented to person, place, and time. Mental status is at baseline.  Psychiatric:        Mood and Affect: Mood normal.        Behavior: Behavior normal.        Thought Content: Thought content normal.        Judgment: Judgment normal.

## 2019-11-20 ENCOUNTER — Encounter: Payer: Self-pay | Admitting: Family Medicine

## 2019-11-21 ENCOUNTER — Telehealth: Payer: Self-pay | Admitting: Family Medicine

## 2019-11-21 DIAGNOSIS — K649 Unspecified hemorrhoids: Secondary | ICD-10-CM

## 2019-11-21 NOTE — Telephone Encounter (Signed)
Pt called stating that she had an appt with Britney last wk for constipation/hemroids. Britney changed her calcium intake. Pt said that seems to be helping but is still having hemroid issues with burning and says it is a miserable feeling and wants advice on what else can be done.

## 2019-11-21 NOTE — Telephone Encounter (Signed)
Pt states she isn't constipated anymore but the burning hasn't gotten any better. She is wondering if she should see GI about it. Please advise.

## 2019-11-22 MED ORDER — HYDROCORTISONE ACETATE 25 MG RE SUPP: 25.0000 mg | Freq: Two times a day (BID) | RECTAL | 0 refills | Status: DC

## 2019-11-22 NOTE — Telephone Encounter (Signed)
Done

## 2019-11-22 NOTE — Telephone Encounter (Signed)
Patient would like referral to GI doctor, Richmond Campbell, and also have suppositories sent to [facility].

## 2019-11-22 NOTE — Telephone Encounter (Signed)
I am happy to send in a prescription suppository for her to try or refer to GI, or both. Which does she prefer?

## 2019-11-22 NOTE — Telephone Encounter (Signed)
Pt aware.

## 2019-11-30 DIAGNOSIS — K648 Other hemorrhoids: Secondary | ICD-10-CM | POA: Insufficient documentation

## 2019-11-30 DIAGNOSIS — Z86010 Personal history of colonic polyps: Secondary | ICD-10-CM | POA: Insufficient documentation

## 2020-02-16 ENCOUNTER — Ambulatory Visit
Admission: EM | Admit: 2020-02-16 | Discharge: 2020-02-16 | Disposition: A | Discharge status: Home / Self Care | Payer: No Typology Code available for payment source | Attending: Emergency Medicine | Admitting: Emergency Medicine

## 2020-02-16 ENCOUNTER — Other Ambulatory Visit: Payer: Self-pay

## 2020-02-16 ENCOUNTER — Encounter: Payer: Self-pay | Admitting: Emergency Medicine

## 2020-02-16 DIAGNOSIS — J0101 Acute recurrent maxillary sinusitis: Secondary | ICD-10-CM | POA: Diagnosis not present

## 2020-02-16 MED ORDER — AMOXICILLIN-POT CLAVULANATE 875-125 MG PO TABS: 1.0000 | ORAL_TABLET | Freq: Two times a day (BID) | ORAL | 0 refills | Status: DC

## 2020-02-16 MED ORDER — FLUTICASONE PROPIONATE 50 MCG/ACT NA SUSP: 1.0000 | Freq: Every day | NASAL | 0 refills | Status: DC

## 2020-02-16 NOTE — Discharge Instructions (Addendum)
Rest and push fluids Augmentin prescribed.  Take as directed and to completion Continue with OTC ibuprofen/tylenol as needed for pain Follow up with PCP or [facility] if symptoms persists Return or go to the ED if you have any new or worsening symptoms such as fever, chills, worsening sinus pain/pressure, cough, sore throat, chest pain, shortness of breath, abdominal pain, changes in bowel or bladder habits, etc...  

## 2020-02-16 NOTE — ED Triage Notes (Signed)
Pt reports sinus pressure to the RT side and drainage in her throat x 1 week ago.  Pt thinks she has a sinus infection.

## 2020-02-16 NOTE — ED Provider Notes (Signed)
[place]   [id number] 02/16/20 Arrival Time: 1884    Chief Complaint  Patient presents with  . Facial Pain    SUBJECTIVE: History from: patient.  AMYJO MIZRACHI is a 54 y.o. female who presented to the urgent care with a complaint of sinus pressure and sinus pain and sore throat for the past 1 week.  Denies sick exposure to COVID, flu or strep.  Denies recent travel.  Has tried OTC medication without relief.  Denies alleviating or aggravating factors.  Report previous symptoms in the past that resolved with Augmentin.   Denies fever, chills, fatigue, sinus pain, rhinorrhea, sore throat, SOB, wheezing, chest pain, nausea, changes in bowel or bladder habits.    ROS: As per HPI.  All other pertinent ROS negative.      Past Medical History:  Diagnosis Date  . Anxiety   . Carpal tunnel syndrome of right wrist   . Mass of right wrist   . Osteopenia   . Wears contact lenses    Past Surgical History:  Procedure Laterality Date  . CARPAL TUNNEL RELEASE Right 02/02/2014   Procedure: RIGHT CARPAL TUNNEL RELEASE;  Surgeon: Linna Hoff, MD;  Location: [facility];  Service: Orthopedics;  Laterality: Right;  . CESAREAN SECTION  may 2000  . LAPAROSCOPIC ASSISTED VAGINAL HYSTERECTOMY  04-07-2002   w/  left salpingectomy  . MASS EXCISION Right 02/02/2014   Procedure: RIGHT WRIST DEEP  MASS EXCISION ;  Surgeon: Linna Hoff, MD;  Location: [facility];  Service: Orthopedics;  Laterality: Right;  . TONSILLECTOMY  age 34   Allergies  Allergen Reactions  . Ciprofloxacin Hives  . Robitussin (Alcohol Free) [Guaifenesin] Hives   No current facility-administered medications on file prior to encounter.   Current Outpatient Medications on File Prior to Encounter  Medication Sig Dispense Refill  . escitalopram (LEXAPRO) 10 MG tablet Take 1 tablet (10 mg total) by mouth daily. 90 tablet 2  . hydrocortisone (ANUSOL-HC) 25 MG suppository Place 1  suppository (25 mg total) rectally 2 (two) times daily. 12 suppository 0  . omeprazole (PRILOSEC) 20 MG capsule Take 1 capsule (20 mg total) by mouth 2 (two) times daily before a meal. 180 capsule 2  . zolpidem (AMBIEN) 5 MG tablet Take 5 mg by mouth at bedtime as needed.     Social History   Socioeconomic History  . Marital status: Married    Spouse name: Not on file  . Number of children: Not on file  . Years of education: Not on file  . Highest education level: Not on file  Occupational History  . Not on file  Tobacco Use  . Smoking status: Former Smoker    Packs/day: 0.25    Years: 15.00    Pack years: 3.75    Types: Cigarettes    Quit date: 01/27/2004    Years since quitting: 16.0  . Smokeless tobacco: Never Used  Vaping Use  . Vaping Use: Never used  Substance and Sexual Activity  . Alcohol use: Yes    Comment: occasional  . Drug use: No  . Sexual activity: Not on file  Other Topics Concern  . Not on file  Social History Narrative  . Not on file   Social Determinants of Health   Financial Resource Strain:   . Difficulty of Paying Living Expenses: Not on file  Food Insecurity:   . Worried About Charity fundraiser in the Last Year: Not on  file  . [place] in the Last Year: Not on file  Transportation Needs:   . Lack of Transportation (Medical): Not on file  . Lack of Transportation (Non-Medical): Not on file  Physical Activity:   . Days of Exercise per Week: Not on file  . Minutes of Exercise per Session: Not on file  Stress:   . Feeling of Stress : Not on file  Social Connections:   . Frequency of Communication with Friends and Family: Not on file  . Frequency of Social Gatherings with Friends and Family: Not on file  . Attends Religious Services: Not on file  . Active Member of Clubs or Organizations: Not on file  . Attends Archivist Meetings: Not on file  . Marital Status: Not on file  Intimate Partner Violence:   . Fear of Current or  Ex-Partner: Not on file  . Emotionally Abused: Not on file  . Physically Abused: Not on file  . Sexually Abused: Not on file   No family history on file.  OBJECTIVE:  Vitals:   02/16/20 1726 02/16/20 1727  BP:  121/83  Pulse:  64  Resp:  19  Temp:  98.3 F (36.8 C)  TempSrc:  Oral  SpO2:  93%  Weight: 132 lb (59.9 kg)   Height: 5\' 4"  (1.626 m)      General appearance: alert; appears fatigued, but nontoxic; speaking in full sentences and tolerating own secretions HEENT: NCAT; Ears: EACs clear, TMs pearly gray; Eyes: PERRL.  EOM grossly intact. Sinuses: Right maxillary sinuses tender, left maxillary area is nontender; Nose: nares patent without rhinorrhea, Throat: oropharynx clear, tonsils non erythematous or enlarged, uvula midline  Neck: supple without LAD Lungs: unlabored respirations, symmetrical air entry; cough: absent; no respiratory distress; CTAB Heart: regular rate and rhythm.  Radial pulses 2+ symmetrical bilaterally Skin: warm and dry Psychological: alert and cooperative; normal mood and affect  LABS:  No results found for this or any previous visit (from the past 24 hour(s)).   ASSESSMENT & PLAN:  1. Acute recurrent maxillary sinusitis     Meds ordered this encounter  Medications  . amoxicillin-clavulanate (AUGMENTIN) 875-125 MG tablet    Sig: Take 1 tablet by mouth every 12 (twelve) hours.    Dispense:  14 tablet    Refill:  0  . fluticasone (FLONASE) 50 MCG/ACT nasal spray    Sig: Place 1 spray into both nostrils daily for 14 days.    Dispense:  16 g    Refill:  0    Discharge Instructions  Rest and push fluids Augmentin prescribed.  Take as directed and to completion Continue with OTC ibuprofen/tylenol as needed for pain Follow up with PCP or [facility] if symptoms persists Return or go to the ED if you have any new or worsening symptoms such as fever, chills, worsening sinus pain/pressure, cough, sore throat, chest pain, shortness of  breath, abdominal pain, changes in bowel or bladder habits, etc...   Reviewed expectations re: course of current medical issues. Questions answered. Outlined signs and symptoms indicating need for more acute intervention. Patient verbalized understanding. After Visit Summary given.     Note: This document was prepared using Dragon voice recognition software and may include unintentional dictation errors.     Emerson Monte, FNP 02/16/20 1811

## 2020-04-11 ENCOUNTER — Ambulatory Visit: Payer: No Typology Code available for payment source | Admitting: Family Medicine

## 2020-05-23 ENCOUNTER — Encounter: Payer: Self-pay | Admitting: *Deleted

## 2020-06-28 ENCOUNTER — Ambulatory Visit: Payer: Self-pay | Admitting: Family Medicine

## 2020-07-19 ENCOUNTER — Ambulatory Visit: Payer: Self-pay | Admitting: Family Medicine

## 2020-10-28 ENCOUNTER — Telehealth (HOSPITAL_COMMUNITY): Payer: Self-pay

## 2020-10-28 ENCOUNTER — Ambulatory Visit (HOSPITAL_COMMUNITY)
Admission: EM | Admit: 2020-10-28 | Discharge: 2020-10-28 | Disposition: A | Discharge status: Home / Self Care | Payer: No Typology Code available for payment source | Attending: Urgent Care | Admitting: Urgent Care

## 2020-10-28 ENCOUNTER — Other Ambulatory Visit: Payer: Self-pay

## 2020-10-28 ENCOUNTER — Encounter (HOSPITAL_COMMUNITY): Payer: Self-pay

## 2020-10-28 DIAGNOSIS — Z23 Encounter for immunization: Secondary | ICD-10-CM

## 2020-10-28 DIAGNOSIS — S51812A Laceration without foreign body of left forearm, initial encounter: Secondary | ICD-10-CM

## 2020-10-28 DIAGNOSIS — M79632 Pain in left forearm: Secondary | ICD-10-CM

## 2020-10-28 DIAGNOSIS — W540XXA Bitten by dog, initial encounter: Secondary | ICD-10-CM

## 2020-10-28 MED ORDER — AMOXICILLIN-POT CLAVULANATE 875-125 MG PO TABS: 1.0000 | ORAL_TABLET | Freq: Two times a day (BID) | ORAL | 0 refills | Status: DC

## 2020-10-28 MED ORDER — NAPROXEN 500 MG PO TABS: 500.0000 mg | ORAL_TABLET | Freq: Two times a day (BID) | ORAL | 0 refills | Status: DC

## 2020-10-28 MED ORDER — TETANUS-DIPHTH-ACELL PERTUSSIS 5-2.5-18.5 LF-MCG/0.5 IM SUSY
0.5000 mL | PREFILLED_SYRINGE | Freq: Once | INTRAMUSCULAR | Status: AC
  Administered 2020-10-28: 0.5 mL via INTRAMUSCULAR

## 2020-10-28 MED ORDER — LIDOCAINE-EPINEPHRINE 1 %-1:100000 IJ SOLN
INTRAMUSCULAR | Status: AC
  Filled 2020-10-28: qty 1

## 2020-10-28 MED ORDER — TETANUS-DIPHTH-ACELL PERTUSSIS 5-2.5-18.5 LF-MCG/0.5 IM SUSY
PREFILLED_SYRINGE | INTRAMUSCULAR | Status: AC
  Filled 2020-10-28: qty 0.5

## 2020-10-28 NOTE — ED Triage Notes (Addendum)
Pt present dog bite to the left arm. Pt will need a tdap vaccination and pt dog is updated on his  Immunizations.  Per pt if she is giving a prescription she would to take it with her to the  Pharmacy.

## 2020-10-28 NOTE — ED Provider Notes (Signed)
[place]   MRN: [id number] DOB: 1966/06/11  Subjective:   Dominique Sandoval is a 55 y.o. female presenting for suffering a dog bite to the left forearm today.  She needs her Tdap updated.  Patient did come in with some potentially to our clinic.  Dog's vaccines are up-to-date.  No current facility-administered medications for this encounter.  Current Outpatient Medications:  .  amoxicillin-clavulanate (AUGMENTIN) 875-125 MG tablet, Take 1 tablet by mouth every 12 (twelve) hours., Disp: 14 tablet, Rfl: 0 .  escitalopram (LEXAPRO) 10 MG tablet, Take 1 tablet (10 mg total) by mouth daily., Disp: 90 tablet, Rfl: 2 .  fluticasone (FLONASE) 50 MCG/ACT nasal spray, Place 1 spray into both nostrils daily for 14 days., Disp: 16 g, Rfl: 0 .  hydrocortisone (ANUSOL-HC) 25 MG suppository, Place 1 suppository (25 mg total) rectally 2 (two) times daily., Disp: 12 suppository, Rfl: 0 .  omeprazole (PRILOSEC) 20 MG capsule, Take 1 capsule (20 mg total) by mouth 2 (two) times daily before a meal., Disp: 180 capsule, Rfl: 2 .  zolpidem (AMBIEN) 5 MG tablet, Take 5 mg by mouth at bedtime as needed., Disp: , Rfl:    Allergies  Allergen Reactions  . Ciprofloxacin Hives  . Robitussin (Alcohol Free) [Guaifenesin] Hives    Past Medical History:  Diagnosis Date  . Anxiety   . Carpal tunnel syndrome of right wrist   . Mass of right wrist   . Osteopenia   . Wears contact lenses      Past Surgical History:  Procedure Laterality Date  . CARPAL TUNNEL RELEASE Right 02/02/2014   Procedure: RIGHT CARPAL TUNNEL RELEASE;  Surgeon: Linna Hoff, MD;  Location: [facility];  Service: Orthopedics;  Laterality: Right;  . CESAREAN SECTION  may 2000  . LAPAROSCOPIC ASSISTED VAGINAL HYSTERECTOMY  04-07-2002   w/  left salpingectomy  . MASS EXCISION Right 02/02/2014   Procedure: RIGHT WRIST DEEP  MASS EXCISION ;  Surgeon: Linna Hoff, MD;  Location: [facility];   Service: Orthopedics;  Laterality: Right;  . TONSILLECTOMY  age 12    No family history on file.  Social History   Tobacco Use  . Smoking status: Former Smoker    Packs/day: 0.25    Years: 15.00    Pack years: 3.75    Types: Cigarettes    Quit date: 01/27/2004    Years since quitting: 16.7  . Smokeless tobacco: Never Used  Vaping Use  . Vaping Use: Never used  Substance Use Topics  . Alcohol use: Yes    Comment: occasional  . Drug use: No    ROS   Objective:   Vitals: BP 129/80 (BP Location: Right Arm)   Pulse 76   Temp 98.8 F (37.1 C) (Oral)   Resp 16   SpO2 100%   Physical Exam Constitutional:      General: She is not in acute distress.    Appearance: Normal appearance. She is well-developed. She is not ill-appearing.  HENT:     Head: Normocephalic and atraumatic.     Nose: Nose normal.     Mouth/Throat:     Mouth: Mucous membranes are moist.     Pharynx: Oropharynx is clear.  Eyes:     General: No scleral icterus.    Extraocular Movements: Extraocular movements intact.     Pupils: Pupils are equal, round, and reactive to light.  Cardiovascular:     Rate and Rhythm:  Normal rate.  Pulmonary:     Effort: Pulmonary effort is normal.  Musculoskeletal:       Arms:  Skin:    General: Skin is warm and dry.  Neurological:     General: No focal deficit present.     Mental Status: She is alert and oriented to person, place, and time.  Psychiatric:        Mood and Affect: Mood normal.        Behavior: Behavior normal.     PROCEDURE NOTE: laceration repair Verbal consent obtained from patient.  Local anesthesia with 4cc Lidocaine 1% with epinephrine.  Wound explored for tendon, ligament damage. Wound scrubbed with soap and water and rinsed. Wound closed very loosely with #3 4-0 Prolene (simple interrupted) sutures.  Wound cleansed and dressed.   Assessment and Plan :   PDMP not reviewed this encounter.  1. Left forearm pain   2. Dog bite, initial  encounter     Tdap updated.  Given the depth and nature of the wound we will start her on Augmentin, naproxen for pain control.  Discussed wound care for loosely approximated wound.  Otherwise follow-up in about 7 days for suture removal.  Counseled patient on potential for adverse effects with medications prescribed/recommended today, ER and return-to-clinic precautions discussed, patient verbalized understanding.    Jaynee Eagles, PA-C 10/28/20 1815

## 2020-11-22 ENCOUNTER — Other Ambulatory Visit: Payer: Self-pay

## 2020-11-22 ENCOUNTER — Ambulatory Visit: Payer: No Typology Code available for payment source | Admitting: Family Medicine

## 2020-11-22 ENCOUNTER — Encounter: Payer: Self-pay | Admitting: Family Medicine

## 2020-11-22 VITALS — BP 112/73 | HR 72 | Temp 97.8°F | Ht 64.0 in | Wt 136.8 lb

## 2020-11-22 DIAGNOSIS — Z0000 Encounter for general adult medical examination without abnormal findings: Secondary | ICD-10-CM

## 2020-11-22 DIAGNOSIS — K219 Gastro-esophageal reflux disease without esophagitis: Secondary | ICD-10-CM

## 2020-11-22 DIAGNOSIS — F419 Anxiety disorder, unspecified: Secondary | ICD-10-CM

## 2020-11-22 MED ORDER — ESCITALOPRAM OXALATE 10 MG PO TABS: 10.0000 mg | ORAL_TABLET | Freq: Every day | ORAL | 1 refills | Status: DC

## 2020-11-22 MED ORDER — CLONAZEPAM 0.25 MG PO TBDP: 0.2500 mg | ORAL_TABLET | Freq: Every day | ORAL | 0 refills | Status: DC | PRN

## 2020-11-22 NOTE — Progress Notes (Signed)
Assessment & Plan:  1. Anxiety Controlled for the most part, but patient is having trouble at court for her mother's death.  A prescription of clonazepam 0.25 mg was given to take when her anxiety is severe.  Advised that she should try it before the day of court. - escitalopram (LEXAPRO) 10 MG tablet; Take 1 tablet (10 mg total) by mouth daily.  Dispense: 90 tablet; Refill: 1 - clonazePAM (KLONOPIN) 0.25 MG disintegrating tablet; Take 1 tablet (0.25 mg total) by mouth daily as needed (severe anxiety).  Dispense: 10 tablet; Refill: 0 - CMP14+EGFR  2. Gastroesophageal reflux disease without esophagitis Well controlled on current regimen.   3. Healthcare maintenance Record release signed for [facility] to get records for her pap smear, mammogram, and bone density.  Record release also signed for [facility] to get her colonoscopy report.   Return in about 3 months (around 02/22/2021) for annual physical.  Hendricks Limes, MSN, APRN, FNP-C [facility]  Subjective:    Patient ID: Dominique Sandoval, female    DOB: June 08, 1966, 55 y.o.   MRN: [id number]  Patient Care Team: Loman Brooklyn, FNP as PCP - General (Family Medicine)   Chief Complaint:  Chief Complaint  Patient presents with  . Anxiety  . Gastroesophageal Reflux    Check up of chronic medical conditions     HPI: Dominique Sandoval is a 55 y.o. female presenting on 11/22/2020 for Anxiety and Gastroesophageal Reflux (Check up of chronic medical conditions )  Anxiety: Patient reports they had their first court appearance for her mother's death.  States she got up to read a letter to the court and blacked out.  She is taking Lexapro 10 mg for anxiety.  States that all other times she feels well controlled.  They will have to return to court; she feels like this will be in July.  She did have a colonoscopy completed last year with Dr. Earlean Shawl.  New complaints: None  Social history:  Relevant  past medical, surgical, family and social history reviewed and updated as indicated. Interim medical history since our last visit reviewed.  Allergies and medications reviewed and updated.  DATA REVIEWED: CHART IN EPIC  ROS: Negative unless specifically indicated above in HPI.    Current Outpatient Medications:  .  escitalopram (LEXAPRO) 10 MG tablet, Take 1 tablet (10 mg total) by mouth daily., Disp: 90 tablet, Rfl: 2 .  omeprazole (PRILOSEC) 20 MG capsule, Take 1 capsule (20 mg total) by mouth 2 (two) times daily before a meal., Disp: 180 capsule, Rfl: 2 .  fluticasone (FLONASE) 50 MCG/ACT nasal spray, Place 1 spray into both nostrils daily for 14 days., Disp: 16 g, Rfl: 0   Allergies  Allergen Reactions  . Ciprofloxacin Hives  . Other Rash    Robitussin  . Robitussin (Alcohol Free) [Guaifenesin] Hives   Past Medical History:  Diagnosis Date  . Anxiety   . Carpal tunnel syndrome of right wrist   . Mass of right wrist   . Osteopenia   . Wears contact lenses     Past Surgical History:  Procedure Laterality Date  . CARPAL TUNNEL RELEASE Right 02/02/2014   Procedure: RIGHT CARPAL TUNNEL RELEASE;  Surgeon: Linna Hoff, MD;  Location: [facility];  Service: Orthopedics;  Laterality: Right;  . CESAREAN SECTION  may 2000  . LAPAROSCOPIC ASSISTED VAGINAL HYSTERECTOMY  04-07-2002   w/  left salpingectomy  . MASS EXCISION Right 02/02/2014  Procedure: RIGHT WRIST DEEP  MASS EXCISION ;  Surgeon: Linna Hoff, MD;  Location: [facility];  Service: Orthopedics;  Laterality: Right;  . TONSILLECTOMY  age 70    Social History   Socioeconomic History  . Marital status: Married    Spouse name: Not on file  . Number of children: Not on file  . Years of education: Not on file  . Highest education level: Not on file  Occupational History  . Not on file  Tobacco Use  . Smoking status: Former Smoker    Packs/day: 0.25    Years: 15.00    Pack years: 3.75     Types: Cigarettes    Quit date: 01/27/2004    Years since quitting: 16.8  . Smokeless tobacco: Never Used  Vaping Use  . Vaping Use: Never used  Substance and Sexual Activity  . Alcohol use: Yes    Comment: occasional  . Drug use: No  . Sexual activity: Not on file  Other Topics Concern  . Not on file  Social History Narrative  . Not on file   Social Determinants of Health   Financial Resource Strain: Not on file  Food Insecurity: Not on file  Transportation Needs: Not on file  Physical Activity: Not on file  Stress: Not on file  Social Connections: Not on file  Intimate Partner Violence: Not on file        Objective:    BP 112/73   Pulse 72   Temp 97.8 F (36.6 C) (Temporal)   Ht 5' 4"  (1.626 m)   Wt 136 lb 12.8 oz (62.1 kg)   SpO2 98%   BMI 23.48 kg/m   Wt Readings from Last 3 Encounters:  11/22/20 136 lb 12.8 oz (62.1 kg)  02/16/20 132 lb (59.9 kg)  11/17/19 130 lb 9.6 oz (59.2 kg)    Physical Exam Vitals reviewed.  Constitutional:      General: She is not in acute distress.    Appearance: Normal appearance. She is not ill-appearing, toxic-appearing or diaphoretic.  HENT:     Head: Normocephalic and atraumatic.  Eyes:     General: No scleral icterus.       Right eye: No discharge.        Left eye: No discharge.     Conjunctiva/sclera: Conjunctivae normal.  Cardiovascular:     Rate and Rhythm: Normal rate and regular rhythm.     Heart sounds: Normal heart sounds. No murmur heard. No friction rub. No gallop.   Pulmonary:     Effort: Pulmonary effort is normal. No respiratory distress.     Breath sounds: Normal breath sounds. No stridor. No wheezing, rhonchi or rales.  Musculoskeletal:        General: Normal range of motion.     Cervical back: Normal range of motion.  Skin:    General: Skin is warm and dry.     Capillary Refill: Capillary refill takes less than 2 seconds.  Neurological:     General: No focal deficit present.     Mental  Status: She is alert and oriented to person, place, and time. Mental status is at baseline.  Psychiatric:        Mood and Affect: Mood normal.        Behavior: Behavior normal.        Thought Content: Thought content normal.        Judgment: Judgment normal.     Lab Results  Component Value  Date   TSH 1.550 09/08/2017   Lab Results  Component Value Date   WBC 7.8 10/11/2019   HGB 12.3 10/11/2019   HCT 35.5 10/11/2019   MCV 90 10/11/2019   PLT 204 10/11/2019   Lab Results  Component Value Date   NA 140 10/11/2019   K 4.1 10/11/2019   CO2 25 10/11/2019   GLUCOSE 71 10/11/2019   BUN 16 10/11/2019   CREATININE 0.72 10/11/2019   BILITOT 0.3 10/11/2019   ALKPHOS 58 10/11/2019   AST 24 10/11/2019   ALT 19 10/11/2019   PROT 6.5 10/11/2019   ALBUMIN 4.6 10/11/2019   CALCIUM 9.7 10/11/2019   Lab Results  Component Value Date   CHOL 137 10/11/2019   Lab Results  Component Value Date   HDL 60 10/11/2019   Lab Results  Component Value Date   LDLCALC 65 10/11/2019   Lab Results  Component Value Date   TRIG 53 10/11/2019   Lab Results  Component Value Date   CHOLHDL 2.3 10/11/2019   No results found for: HGBA1C

## 2020-11-23 LAB — CMP14+EGFR
ALT: 17 IU/L (ref 0–32)
AST: 20 IU/L (ref 0–40)
Albumin/Globulin Ratio: 2.1 (ref 1.2–2.2)
Albumin: 4.6 g/dL (ref 3.8–4.9)
Alkaline Phosphatase: 57 IU/L (ref 44–121)
BUN/Creatinine Ratio: 27 — ABNORMAL HIGH (ref 9–23)
BUN: 18 mg/dL (ref 6–24)
Bilirubin Total: 0.4 mg/dL (ref 0.0–1.2)
CO2: 22 mmol/L (ref 20–29)
Calcium: 9.7 mg/dL (ref 8.7–10.2)
Chloride: 103 mmol/L (ref 96–106)
Creatinine, Ser: 0.67 mg/dL (ref 0.57–1.00)
Globulin, Total: 2.2 g/dL (ref 1.5–4.5)
Glucose: 86 mg/dL (ref 65–99)
Potassium: 4.7 mmol/L (ref 3.5–5.2)
Sodium: 141 mmol/L (ref 134–144)
Total Protein: 6.8 g/dL (ref 6.0–8.5)
eGFR: 104 mL/min/{1.73_m2} (ref >59)

## 2020-11-25 ENCOUNTER — Encounter: Payer: Self-pay | Admitting: Family Medicine

## 2021-04-04 ENCOUNTER — Ambulatory Visit
Admission: EM | Admit: 2021-04-04 | Discharge: 2021-04-04 | Disposition: A | Discharge status: Home / Self Care | Payer: No Typology Code available for payment source | Attending: Internal Medicine | Admitting: Internal Medicine

## 2021-04-04 ENCOUNTER — Other Ambulatory Visit: Payer: Self-pay

## 2021-04-04 DIAGNOSIS — H6983 Other specified disorders of Eustachian tube, bilateral: Secondary | ICD-10-CM | POA: Diagnosis not present

## 2021-04-04 NOTE — ED Triage Notes (Signed)
Pt dx with covid on 9/17, Pt reports an onset of left ear pain with a clogged sensation that has continued since having covid. Onset yesterday of right ear sxs. Pt also complains of intermittent left sided upper jaw pain that worsens at night. Has taken some prednisone without a decrease in sxs.

## 2021-04-04 NOTE — ED Provider Notes (Signed)
[facility]    CSN: [id number] Arrival date & time: 04/04/21  1046      History   Chief Complaint Chief Complaint  Patient presents with   Otalgia    bilateral   Jaw Pain    HPI Dominique Sandoval is a 55 y.o. female comes to the urgent care with left ear fullness and pain of 2 weeks duration.  Patient's symptoms when she was diagnosed with COVID-19 infection.  Her diagnosis was made on 9/17.  Since then patient complains of left ear pain with some fullness in the left ear as well as a bubbling sensation in the left ear.  No fever or chills.  No ear discharge.  No ringing in the ear or hearing difficulties.  No nausea or vomiting.  Yesterday the right ear started exhibiting the same symptoms as the visit to the urgent care.  Patient is currently gargling warm salt water twice daily.  She cannot tolerate allergy medications or nasal spray.  HPI  Past Medical History:  Diagnosis Date   Anxiety    Carpal tunnel syndrome of right wrist    Mass of right wrist    Osteopenia    Wears contact lenses     Patient Active Problem List   Diagnosis Date Noted   History of adenomatous polyp of colon 11/30/2019   Internal hemorrhoids 11/30/2019   Osteopenia of multiple sites 10/15/2019   Anxiety 04/19/2018   Generalized abdominal pain 04/19/2018   DDD (degenerative disc disease), cervical 07/20/2017   Neural foraminal stenosis of cervical spine 07/20/2017   Cervical spondylosis without myelopathy 07/20/2017   Arthropathy of cervical facet joint 07/20/2017   Constipation 03/28/2017   Gastroesophageal reflux disease without esophagitis 03/28/2017   Primary insomnia 09/19/2016   BENIGN NEOPLASM OF ADRENAL GLAND 10/07/2007    Past Surgical History:  Procedure Laterality Date   CARPAL TUNNEL RELEASE Right 02/02/2014   Procedure: RIGHT CARPAL TUNNEL RELEASE;  Surgeon: Linna Hoff, MD;  Location: [place];  Service: Orthopedics;  Laterality: Right;   CESAREAN  SECTION  may 2000   LAPAROSCOPIC ASSISTED VAGINAL HYSTERECTOMY  04-07-2002   w/  left salpingectomy   MASS EXCISION Right 02/02/2014   Procedure: RIGHT WRIST DEEP  MASS EXCISION ;  Surgeon: Linna Hoff, MD;  Location: [place];  Service: Orthopedics;  Laterality: Right;   TONSILLECTOMY  age 64    OB History   No obstetric history on file.      Home Medications    Prior to Admission medications   Medication Sig Start Date End Date Taking? Authorizing Provider  escitalopram (LEXAPRO) 10 MG tablet Take 1 tablet (10 mg total) by mouth daily. 11/22/20   Loman Brooklyn, FNP  omeprazole (PRILOSEC) 20 MG capsule Take 1 capsule (20 mg total) by mouth 2 (two) times daily before a meal. 10/11/19   Loman Brooklyn, FNP    Family History No family history on file.  Social History Social History   Tobacco Use   Smoking status: Former    Packs/day: 0.25    Years: 15.00    Pack years: 3.75    Types: Cigarettes    Quit date: 01/27/2004    Years since quitting: 17.1   Smokeless tobacco: Never  Vaping Use   Vaping Use: Never used  Substance Use Topics   Alcohol use: Yes    Comment: occasional   Drug use: No     Allergies   Ciprofloxacin,  Other, Robitussin (alcohol free) [guaifenesin], and Wellbutrin [bupropion]   Review of Systems Review of Systems  HENT:  Positive for ear pain. Negative for ear discharge, facial swelling, hearing loss, sinus pressure, sinus pain and sore throat.   Eyes: Negative.     Physical Exam Triage Vital Signs ED Triage Vitals  Enc Vitals Group     BP 04/04/21 1055 137/85     Pulse Rate 04/04/21 1055 77     Resp 04/04/21 1055 18     Temp 04/04/21 1055 98 F (36.7 C)     Temp Source 04/04/21 1055 Oral     SpO2 04/04/21 1055 98 %     Weight --      Height --      Head Circumference --      Peak Flow --      Pain Score 04/04/21 1058 4     Pain Loc --      Pain Edu? --      Excl. in [place]? --    No data found.  Updated  Vital Signs BP 137/85 (BP Location: Right Arm)   Pulse 77   Temp 98 F (36.7 C) (Oral)   Resp 18   SpO2 98%   Visual Acuity Right Eye Distance:   Left Eye Distance:   Bilateral Distance:    Right Eye Near:   Left Eye Near:    Bilateral Near:     Physical Exam Vitals and nursing note reviewed.  Constitutional:      General: She is not in acute distress.    Appearance: Normal appearance. She is not ill-appearing, toxic-appearing or diaphoretic.  HENT:     Right Ear: Tympanic membrane, ear canal and external ear normal. There is no impacted cerumen.     Left Ear: Tympanic membrane, ear canal and external ear normal. There is no impacted cerumen.     Ears:     Comments: Right middle ear effusion.    Mouth/Throat:     Mouth: Mucous membranes are moist.     Pharynx: No oropharyngeal exudate or posterior oropharyngeal erythema.  Eyes:     Pupils: Pupils are equal, round, and reactive to light.  Neurological:     Mental Status: She is alert.     UC Treatments / Results  Labs (all labs ordered are listed, but only abnormal results are displayed) Labs Reviewed - No data to display  EKG   Radiology No results found.  Procedures Procedures (including critical care time)  Medications Ordered in UC Medications - No data to display  Initial Impression / Assessment and Plan / UC Course  I have reviewed the triage vital signs and the nursing notes.  Pertinent labs & imaging results that were available during my care of the patient were reviewed by me and considered in my medical decision making (see chart for details).     Eustachian tube dysfunction: Warm salt water gargle Patient cannot tolerate allergy medications Return precautions given. Final Clinical Impressions(s) / UC Diagnoses   Final diagnoses:  Eustachian tube dysfunction, bilateral     Discharge Instructions      Please resort to home remedies such as warm salt water gargle. If you have  worsening symptoms please return to urgent care to be reevaluated.   ED Prescriptions   None    PDMP not reviewed this encounter.   Chase Picket, MD 04/04/21 1209

## 2021-04-04 NOTE — Discharge Instructions (Addendum)
Please resort to home remedies such as warm salt water gargle. If you have worsening symptoms please return to urgent care to be reevaluated.

## 2021-05-09 ENCOUNTER — Encounter: Payer: Self-pay | Admitting: Family Medicine

## 2022-06-20 ENCOUNTER — Ambulatory Visit
Admission: RE | Admit: 2022-06-20 | Discharge: 2022-06-20 | Disposition: A | Discharge status: Home / Self Care | Payer: No Typology Code available for payment source | Source: Ambulatory Visit

## 2022-06-20 VITALS — BP 126/78 | HR 89 | Temp 99.9°F | Resp 18

## 2022-06-20 DIAGNOSIS — M722 Plantar fascial fibromatosis: Secondary | ICD-10-CM

## 2022-06-20 MED ORDER — PREDNISONE 20 MG PO TABS: 40.0000 mg | ORAL_TABLET | Freq: Every day | ORAL | 0 refills | Status: AC

## 2022-06-20 NOTE — ED Provider Notes (Signed)
[facility]    CSN: [id number] Arrival date & time: 06/20/22  1422      History   Chief Complaint Chief Complaint  Patient presents with   Foot Pain    Entered by patient    HPI Dominique Sandoval is a 56 y.o. female.   The history is provided by the patient.   Patient presents for complaints of left foot pain that is been present over the past 3 months.  Patient reports that over the last month, she has had increasing pain.  She reports she has been going to the chiropractor for her left foot pain that has been diagnosed as plantar fasciitis.  She states that her symptoms have improved, but over the last several days, she has noticed increasing pain because she has been on her feet more.  She states that she does wear orthopedic shoes.  She denies injury, trauma, or radiation of pain.  Patient reports that the pain is now in the middle of her foot that extends into the heel.  Reports that she would like to see if prednisone would help her symptoms.  Past Medical History:  Diagnosis Date   Anxiety    Carpal tunnel syndrome of right wrist    Mass of right wrist    Osteopenia    Wears contact lenses     Patient Active Problem List   Diagnosis Date Noted   History of adenomatous polyp of colon 11/30/2019   Internal hemorrhoids 11/30/2019   Osteopenia of multiple sites 10/15/2019   Anxiety 04/19/2018   Generalized abdominal pain 04/19/2018   DDD (degenerative disc disease), cervical 07/20/2017   Neural foraminal stenosis of cervical spine 07/20/2017   Cervical spondylosis without myelopathy 07/20/2017   Arthropathy of cervical facet joint 07/20/2017   Constipation 03/28/2017   Gastroesophageal reflux disease without esophagitis 03/28/2017   Primary insomnia 09/19/2016   BENIGN NEOPLASM OF ADRENAL GLAND 10/07/2007    Past Surgical History:  Procedure Laterality Date   CARPAL TUNNEL RELEASE Right 02/02/2014   Procedure: RIGHT CARPAL TUNNEL RELEASE;  Surgeon:  Linna Hoff, MD;  Location: [place];  Service: Orthopedics;  Laterality: Right;   CESAREAN SECTION  may 2000   LAPAROSCOPIC ASSISTED VAGINAL HYSTERECTOMY  04-07-2002   w/  left salpingectomy   MASS EXCISION Right 02/02/2014   Procedure: RIGHT WRIST DEEP  MASS EXCISION ;  Surgeon: Linna Hoff, MD;  Location: [place];  Service: Orthopedics;  Laterality: Right;   TONSILLECTOMY  age 29    OB History   No obstetric history on file.      Home Medications    Prior to Admission medications   Medication Sig Start Date End Date Taking? Authorizing Provider  predniSONE (DELTASONE) 20 MG tablet Take 2 tablets (40 mg total) by mouth daily with breakfast for 5 days. 06/20/22 06/25/22 Yes Goldman Birchall-Warren, Alda Lea, NP  propranolol (INDERAL) 40 MG tablet Take 40 mg by mouth 3 (three) times daily.   Yes [provider]  escitalopram (LEXAPRO) 10 MG tablet Take 1 tablet (10 mg total) by mouth daily. 11/22/20   Loman Brooklyn, FNP  omeprazole (PRILOSEC) 20 MG capsule Take 1 capsule (20 mg total) by mouth 2 (two) times daily before a meal. 10/11/19   Loman Brooklyn, FNP    Family History History reviewed. No pertinent family history.  Social History Social History   Tobacco Use   Smoking status: Former    Packs/day: 0.25  Years: 15.00    Total pack years: 3.75    Types: Cigarettes    Quit date: 01/27/2004    Years since quitting: 18.4   Smokeless tobacco: Never  Vaping Use   Vaping Use: Never used  Substance Use Topics   Alcohol use: Yes    Comment: occasional   Drug use: No     Allergies   Ciprofloxacin, Other, Robitussin (alcohol free) [guaifenesin], and Wellbutrin [bupropion]   Review of Systems Review of Systems Per HPI  Physical Exam Triage Vital Signs ED Triage Vitals  Enc Vitals Group     BP 06/20/22 1455 126/78     Pulse Rate 06/20/22 1455 89     Resp 06/20/22 1455 18     Temp 06/20/22 1455 99.9 F (37.7 C)      Temp Source 06/20/22 1455 Oral     SpO2 06/20/22 1455 99 %     Weight --      Height --      Head Circumference --      Peak Flow --      Pain Score 06/20/22 1456 4     Pain Loc --      Pain Edu? --      Excl. in [place]? --    No data found.  Updated Vital Signs BP 126/78 (BP Location: Right Arm)   Pulse 89   Temp 99.9 F (37.7 C) (Oral)   Resp 18   SpO2 99%   Visual Acuity Right Eye Distance:   Left Eye Distance:   Bilateral Distance:    Right Eye Near:   Left Eye Near:    Bilateral Near:     Physical Exam Vitals and nursing note reviewed.  Constitutional:      General: She is not in acute distress.    Appearance: Normal appearance.  HENT:     Head: Normocephalic.  Eyes:     Extraocular Movements: Extraocular movements intact.     Pupils: Pupils are equal, round, and reactive to light.  Pulmonary:     Effort: Pulmonary effort is normal.  Musculoskeletal:     Cervical back: Normal range of motion.     Left foot: Tenderness (Tenderness noted to the midfoot and calcaneus of the left foot.) present. No swelling or deformity.     Comments: Unable to heel walk.  Lymphadenopathy:     Cervical: No cervical adenopathy.  Skin:    General: Skin is warm and dry.  Neurological:     General: No focal deficit present.     Mental Status: She is alert and oriented to person, place, and time.  Psychiatric:        Mood and Affect: Mood normal.        Behavior: Behavior normal.      UC Treatments / Results  Labs (all labs ordered are listed, but only abnormal results are displayed) Labs Reviewed - No data to display  EKG   Radiology No results found.  Procedures Procedures (including critical care time)  Medications Ordered in UC Medications - No data to display  Initial Impression / Assessment and Plan / UC Course  I have reviewed the triage vital signs and the nursing notes.  Pertinent labs & imaging results that were available during my care of the patient  were reviewed by me and considered in my medical decision making (see chart for details).  She is well-appearing, she is in no acute distress, vital signs are stable.  Symptoms are  consistent with plantar fasciitis.  Given the patient's known diagnosis of plantar fasciitis, will forego imaging at this time, exam is consistent with same.  Will start patient on prednisone 40 mg for the next 5 days.  Supportive care recommendations were provided to the patient.  Patient verbalizes understanding.  All questions were answered.  Patient stable for discharge.   Final Clinical Impressions(s) / UC Diagnoses   Final diagnoses:  Plantar fasciitis of left foot     Discharge Instructions      Take medication as prescribed. May take over-the-counter Tylenol as needed for pain or discomfort.  When you complete the prednisone, you may take ibuprofen as needed. Recommend freezing a water bottle and rolling the left foot over the water bottle for pain or discomfort. Continue to wear the orthotic shoes that you currently have. As discussed, if symptoms worsen or fail to improve, you will need to consider following up with orthopedics for further evaluation. Follow-up as needed.     ED Prescriptions     Medication Sig Dispense Auth. Provider   predniSONE (DELTASONE) 20 MG tablet Take 2 tablets (40 mg total) by mouth daily with breakfast for 5 days. 10 tablet Adriaan Maltese-Warren, Alda Lea, NP      PDMP not reviewed this encounter.   Tish Men, NP 06/20/22 1529

## 2022-06-20 NOTE — Discharge Instructions (Addendum)
Take medication as prescribed. May take over-the-counter Tylenol as needed for pain or discomfort.  When you complete the prednisone, you may take ibuprofen as needed. Recommend freezing a water bottle and rolling the left foot over the water bottle for pain or discomfort. Continue to wear the orthotic shoes that you currently have. As discussed, if symptoms worsen or fail to improve, you will need to consider following up with orthopedics for further evaluation. Follow-up as needed.

## 2022-06-20 NOTE — ED Triage Notes (Signed)
Pain in left foot that started 3 months ago, if she has been on her feet for along time working it will bother her a lot once she rest. Body aches and chills, fever that started yesterday.

## 2022-06-21 ENCOUNTER — Encounter: Payer: Self-pay | Admitting: Emergency Medicine

## 2022-06-21 ENCOUNTER — Ambulatory Visit
Admission: EM | Admit: 2022-06-21 | Discharge: 2022-06-21 | Disposition: A | Discharge status: Home / Self Care | Payer: No Typology Code available for payment source | Attending: Nurse Practitioner | Admitting: Nurse Practitioner

## 2022-06-21 DIAGNOSIS — B349 Viral infection, unspecified: Secondary | ICD-10-CM | POA: Diagnosis present

## 2022-06-21 DIAGNOSIS — R6889 Other general symptoms and signs: Secondary | ICD-10-CM | POA: Insufficient documentation

## 2022-06-21 DIAGNOSIS — Z1152 Encounter for screening for COVID-19: Secondary | ICD-10-CM | POA: Insufficient documentation

## 2022-06-21 MED ORDER — OSELTAMIVIR PHOSPHATE 75 MG PO CAPS: 75.0000 mg | ORAL_CAPSULE | Freq: Two times a day (BID) | ORAL | 0 refills | Status: DC

## 2022-06-21 NOTE — ED Triage Notes (Signed)
Fever and chills, pain in shoulder, productive cough with green sputum.  Symptoms started last night.  Has been taking tylenol

## 2022-06-21 NOTE — ED Provider Notes (Signed)
[facility]    CSN: [id number] Arrival date & time: 06/21/22  1884      History   Chief Complaint No chief complaint on file.   HPI Dominique Sandoval is a 56 y.o. female.   The history is provided by the patient.   Patient presents for complaints of fever, chills, generalized bodyaches, and productive cough.  Symptoms have been present over the past 24 hours.  Patient denies sore throat, headache, wheezing, shortness of breath, or difficulty breathing, or GI symptoms.  Patient reports she does have a decreased appetite however.  Patient reports she has been taking Tylenol for her symptoms.  Past Medical History:  Diagnosis Date   Anxiety    Carpal tunnel syndrome of right wrist    Mass of right wrist    Osteopenia    Wears contact lenses     Patient Active Problem List   Diagnosis Date Noted   History of adenomatous polyp of colon 11/30/2019   Internal hemorrhoids 11/30/2019   Osteopenia of multiple sites 10/15/2019   Anxiety 04/19/2018   Generalized abdominal pain 04/19/2018   DDD (degenerative disc disease), cervical 07/20/2017   Neural foraminal stenosis of cervical spine 07/20/2017   Cervical spondylosis without myelopathy 07/20/2017   Arthropathy of cervical facet joint 07/20/2017   Constipation 03/28/2017   Gastroesophageal reflux disease without esophagitis 03/28/2017   Primary insomnia 09/19/2016   BENIGN NEOPLASM OF ADRENAL GLAND 10/07/2007    Past Surgical History:  Procedure Laterality Date   CARPAL TUNNEL RELEASE Right 02/02/2014   Procedure: RIGHT CARPAL TUNNEL RELEASE;  Surgeon: Linna Hoff, MD;  Location: [place];  Service: Orthopedics;  Laterality: Right;   CESAREAN SECTION  may 2000   LAPAROSCOPIC ASSISTED VAGINAL HYSTERECTOMY  04-07-2002   w/  left salpingectomy   MASS EXCISION Right 02/02/2014   Procedure: RIGHT WRIST DEEP  MASS EXCISION ;  Surgeon: Linna Hoff, MD;  Location: [place];   Service: Orthopedics;  Laterality: Right;   TONSILLECTOMY  age 23    OB History   No obstetric history on file.      Home Medications    Prior to Admission medications   Medication Sig Start Date End Date Taking? Authorizing Provider  oseltamivir (TAMIFLU) 75 MG capsule Take 1 capsule (75 mg total) by mouth every 12 (twelve) hours. 06/21/22  Yes Delaila Nand-Warren, Alda Lea, NP  escitalopram (LEXAPRO) 10 MG tablet Take 1 tablet (10 mg total) by mouth daily. 11/22/20   Loman Brooklyn, FNP  omeprazole (PRILOSEC) 20 MG capsule Take 1 capsule (20 mg total) by mouth 2 (two) times daily before a meal. 10/11/19   Loman Brooklyn, FNP  predniSONE (DELTASONE) 20 MG tablet Take 2 tablets (40 mg total) by mouth daily with breakfast for 5 days. 06/20/22 06/25/22  Myrissa Chipley-Warren, Alda Lea, NP  propranolol (INDERAL) 40 MG tablet Take 40 mg by mouth 3 (three) times daily.    [provider]    Family History History reviewed. No pertinent family history.  Social History Social History   Tobacco Use   Smoking status: Former    Packs/day: 0.25    Years: 15.00    Total pack years: 3.75    Types: Cigarettes    Quit date: 01/27/2004    Years since quitting: 18.4   Smokeless tobacco: Never  Vaping Use   Vaping Use: Never used  Substance Use Topics   Alcohol use: Yes    Comment: occasional  Drug use: No     Allergies   Ciprofloxacin, Other, Robitussin (alcohol free) [guaifenesin], and Wellbutrin [bupropion]   Review of Systems Review of Systems Per HPI  Physical Exam Triage Vital Signs ED Triage Vitals  Enc Vitals Group     BP 06/21/22 1135 120/80     Pulse Rate 06/21/22 1135 90     Resp 06/21/22 1135 18     Temp 06/21/22 1135 (!) 100.5 F (38.1 C)     Temp Source 06/21/22 1135 Oral     SpO2 06/21/22 1135 95 %     Weight --      Height --      Head Circumference --      Peak Flow --      Pain Score 06/21/22 1137 8     Pain Loc --      Pain Edu? --      Excl. in  [place]? --    No data found.  Updated Vital Signs BP 120/80 (BP Location: Right Arm)   Pulse 90   Temp (!) 100.5 F (38.1 C) (Oral)   Resp 18   SpO2 95%   Visual Acuity Right Eye Distance:   Left Eye Distance:   Bilateral Distance:    Right Eye Near:   Left Eye Near:    Bilateral Near:     Physical Exam Vitals and nursing note reviewed.  Constitutional:      General: She is not in acute distress.    Appearance: Normal appearance. She is well-developed.  HENT:     Head: Normocephalic and atraumatic.     Right Ear: Tympanic membrane, ear canal and external ear normal.     Left Ear: Tympanic membrane, ear canal and external ear normal.     Nose: Congestion present. No rhinorrhea.     Right Turbinates: Enlarged and swollen.     Left Turbinates: Enlarged and swollen.     Right Sinus: No maxillary sinus tenderness or frontal sinus tenderness.     Left Sinus: No maxillary sinus tenderness or frontal sinus tenderness.     Mouth/Throat:     Lips: Pink.     Mouth: Mucous membranes are moist.     Pharynx: Uvula midline. Posterior oropharyngeal erythema present. No pharyngeal swelling, oropharyngeal exudate or uvula swelling.     Tonsils: No tonsillar exudate.  Eyes:     Extraocular Movements: Extraocular movements intact.     Conjunctiva/sclera: Conjunctivae normal.     Pupils: Pupils are equal, round, and reactive to light.  Cardiovascular:     Rate and Rhythm: Normal rate and regular rhythm.     Pulses: Normal pulses.     Heart sounds: Normal heart sounds. No murmur heard. Pulmonary:     Effort: Pulmonary effort is normal. No respiratory distress.     Breath sounds: Normal breath sounds.  Abdominal:     General: Bowel sounds are normal.     Palpations: Abdomen is soft.     Tenderness: There is no abdominal tenderness.  Musculoskeletal:     Cervical back: Normal range of motion.  Lymphadenopathy:     Cervical: No cervical adenopathy.  Skin:    General: Skin is warm and  dry.     Capillary Refill: Capillary refill takes less than 2 seconds.  Neurological:     General: No focal deficit present.     Mental Status: She is alert and oriented to person, place, and time.  Psychiatric:  Mood and Affect: Mood normal.        Behavior: Behavior normal.      UC Treatments / Results  Labs (all labs ordered are listed, but only abnormal results are displayed) Labs Reviewed  SARS CORONAVIRUS 2 (TAT 6-24 HRS)    EKG   Radiology No results found.  Procedures Procedures (including critical care time)  Medications Ordered in UC Medications - No data to display  Initial Impression / Assessment and Plan / UC Course  I have reviewed the triage vital signs and the nursing notes.  Pertinent labs & imaging results that were available during my care of the patient were reviewed by me and considered in my medical decision making (see chart for details).  The patient is well-appearing, she is in no acute distress, she is currently febrile, vital signs are otherwise stable.  COVID test is pending, suspect a viral illness, likely influenza at this time.  Clinical diagnosis made based on the patient's current presentation.  Tamiflu 75 mg prescribed at this time.  Patient was advised to continue over-the-counter Tylenol or ibuprofen for pain, fever, general discomfort.  Patient elects to use over-the-counter cough and cold medications for her symptoms.  Discussed viral etiology with the patient and patient was given return precautions.  Patient verbalizes understanding.  All questions were answered.  Patient is stable for discharge.   Final Clinical Impressions(s) / UC Diagnoses   Final diagnoses:  Encounter for screening for COVID-19  Flu-like symptoms  Viral illness     Discharge Instructions      Take medication as prescribed. Increase fluids and allow for plenty of rest. Recommend using a humidifier in the bedroom at nighttime during sleep and  sleeping elevated on pillows while cough symptoms persist. Continue over-the-counter Tylenol or ibuprofen as needed for pain, fever, or general discomfort. As discussed, a viral illness can last from 10 to 14 days.  If your symptoms worsen before that time, or extend beyond that time, please follow-up in this clinic or with your primary care physician for further evaluation. Follow-up as needed.     ED Prescriptions     Medication Sig Dispense Auth. Provider   oseltamivir (TAMIFLU) 75 MG capsule Take 1 capsule (75 mg total) by mouth every 12 (twelve) hours. 10 capsule Keyshon Stein-Warren, Alda Lea, NP      PDMP not reviewed this encounter.   Tish Men, NP 06/21/22 1213

## 2022-06-21 NOTE — Discharge Instructions (Addendum)
Take medication as prescribed. Increase fluids and allow for plenty of rest. Recommend using a humidifier in the bedroom at nighttime during sleep and sleeping elevated on pillows while cough symptoms persist. Continue over-the-counter Tylenol or ibuprofen as needed for pain, fever, or general discomfort. As discussed, a viral illness can last from 10 to 14 days.  If your symptoms worsen before that time, or extend beyond that time, please follow-up in this clinic or with your primary care physician for further evaluation. Follow-up as needed.

## 2022-06-22 LAB — SARS CORONAVIRUS 2 (TAT 6-24 HRS): SARS Coronavirus 2: NEGATIVE

## 2022-07-12 ENCOUNTER — Ambulatory Visit
Admission: EM | Admit: 2022-07-12 | Discharge: 2022-07-12 | Disposition: A | Discharge status: Home / Self Care | Payer: No Typology Code available for payment source

## 2022-07-12 DIAGNOSIS — J069 Acute upper respiratory infection, unspecified: Secondary | ICD-10-CM | POA: Diagnosis not present

## 2022-07-12 DIAGNOSIS — J208 Acute bronchitis due to other specified organisms: Secondary | ICD-10-CM | POA: Diagnosis not present

## 2022-07-12 MED ORDER — PROMETHAZINE-DM 6.25-15 MG/5ML PO SYRP: 5.0000 mL | ORAL_SOLUTION | Freq: Four times a day (QID) | ORAL | 0 refills | Status: DC | PRN

## 2022-07-12 MED ORDER — DEXAMETHASONE SODIUM PHOSPHATE 10 MG/ML IJ SOLN
10.0000 mg | Freq: Once | INTRAMUSCULAR | Status: AC
  Administered 2022-07-12: 10 mg via INTRAMUSCULAR

## 2022-07-12 MED ORDER — FLUTICASONE PROPIONATE 50 MCG/ACT NA SUSP: 1.0000 | Freq: Two times a day (BID) | NASAL | 2 refills | Status: DC

## 2022-07-12 NOTE — ED Provider Notes (Signed)
[facility]    CSN: [id number] Arrival date & time: 07/12/22  1104      History   Chief Complaint No chief complaint on file.   HPI Dominique Sandoval is a 57 y.o. female.   Patient presenting today with 5-day history of sore throat, ear pressure, ongoing nasal congestion and sinus pressure, chest congestion, productive cough.  Denies fever, chills, chest pain, shortness of breath, wheezing, abdominal pain, nausea vomiting or diarrhea.  Has a history of seasonal allergies on as needed medications for this.  Also taking Mucinex with minimal relief.  No known sick contacts recently.    Past Medical History:  Diagnosis Date   Anxiety    Carpal tunnel syndrome of right wrist    Mass of right wrist    Osteopenia    Wears contact lenses     Patient Active Problem List   Diagnosis Date Noted   History of adenomatous polyp of colon 11/30/2019   Internal hemorrhoids 11/30/2019   Osteopenia of multiple sites 10/15/2019   Anxiety 04/19/2018   Generalized abdominal pain 04/19/2018   DDD (degenerative disc disease), cervical 07/20/2017   Neural foraminal stenosis of cervical spine 07/20/2017   Cervical spondylosis without myelopathy 07/20/2017   Arthropathy of cervical facet joint 07/20/2017   Constipation 03/28/2017   Gastroesophageal reflux disease without esophagitis 03/28/2017   Primary insomnia 09/19/2016   BENIGN NEOPLASM OF ADRENAL GLAND 10/07/2007    Past Surgical History:  Procedure Laterality Date   CARPAL TUNNEL RELEASE Right 02/02/2014   Procedure: RIGHT CARPAL TUNNEL RELEASE;  Surgeon: Linna Hoff, MD;  Location: [facility];  Service: Orthopedics;  Laterality: Right;   CESAREAN SECTION  may 2000   LAPAROSCOPIC ASSISTED VAGINAL HYSTERECTOMY  04-07-2002   w/  left salpingectomy   MASS EXCISION Right 02/02/2014   Procedure: RIGHT WRIST DEEP  MASS EXCISION ;  Surgeon: Linna Hoff, MD;  Location: [facility];  Service:  Orthopedics;  Laterality: Right;   TONSILLECTOMY  age 79    OB History   No obstetric history on file.      Home Medications    Prior to Admission medications   Medication Sig Start Date End Date Taking? Authorizing Provider  fluticasone (FLONASE) 50 MCG/ACT nasal spray Place 1 spray into both nostrils 2 (two) times daily. 07/12/22  Yes Volney American, PA-C  pantoprazole (PROTONIX) 40 MG tablet Take 40 mg by mouth daily.   Yes [provider]  promethazine-dextromethorphan (PROMETHAZINE-DM) 6.25-15 MG/5ML syrup Take 5 mLs by mouth 4 (four) times daily as needed. 07/12/22  Yes Volney American, PA-C  escitalopram (LEXAPRO) 10 MG tablet Take 1 tablet (10 mg total) by mouth daily. 11/22/20   Loman Brooklyn, FNP  omeprazole (PRILOSEC) 20 MG capsule Take 1 capsule (20 mg total) by mouth 2 (two) times daily before a meal. 10/11/19   Loman Brooklyn, FNP  oseltamivir (TAMIFLU) 75 MG capsule Take 1 capsule (75 mg total) by mouth every 12 (twelve) hours. 06/21/22   Leath-Warren, Alda Lea, NP  propranolol (INDERAL) 40 MG tablet Take 40 mg by mouth 3 (three) times daily.    [provider]    Family History History reviewed. No pertinent family history.  Social History Social History   Tobacco Use   Smoking status: Former    Packs/day: 0.25    Years: 15.00    Total pack years: 3.75    Types: Cigarettes    Quit date:  01/27/2004    Years since quitting: 18.4   Smokeless tobacco: Never  Vaping Use   Vaping Use: Never used  Substance Use Topics   Alcohol use: Yes    Comment: occasional   Drug use: No     Allergies   Ciprofloxacin, Other, Robitussin (alcohol free) [guaifenesin], and Wellbutrin [bupropion]   Review of Systems Review of Systems Per HPI  Physical Exam Triage Vital Signs ED Triage Vitals  Enc Vitals Group     BP 07/12/22 1236 122/85     Pulse Rate 07/12/22 1236 87     Resp 07/12/22 1236 20     Temp 07/12/22 1236 98.2 F (36.8  C)     Temp Source 07/12/22 1236 Oral     SpO2 07/12/22 1236 99 %     Weight --      Height --      Head Circumference --      Peak Flow --      Pain Score 07/12/22 1237 0     Pain Loc --      Pain Edu? --      Excl. in [place]? --    No data found.  Updated Vital Signs BP 122/85 (BP Location: Right Arm)   Pulse 87   Temp 98.2 F (36.8 C) (Oral)   Resp 20   SpO2 99%   Visual Acuity Right Eye Distance:   Left Eye Distance:   Bilateral Distance:    Right Eye Near:   Left Eye Near:    Bilateral Near:     Physical Exam Vitals and nursing note reviewed.  Constitutional:      Appearance: Normal appearance.  HENT:     Head: Atraumatic.     Right Ear: Tympanic membrane and external ear normal.     Left Ear: Tympanic membrane and external ear normal.     Nose: Rhinorrhea present.     Mouth/Throat:     Mouth: Mucous membranes are moist.     Pharynx: Posterior oropharyngeal erythema present.  Eyes:     Extraocular Movements: Extraocular movements intact.     Conjunctiva/sclera: Conjunctivae normal.  Cardiovascular:     Rate and Rhythm: Normal rate and regular rhythm.     Heart sounds: Normal heart sounds.  Pulmonary:     Effort: Pulmonary effort is normal.     Breath sounds: Normal breath sounds. No wheezing or rales.  Musculoskeletal:        General: Normal range of motion.     Cervical back: Normal range of motion and neck supple.  Skin:    General: Skin is warm and dry.  Neurological:     Mental Status: She is alert and oriented to person, place, and time.  Psychiatric:        Mood and Affect: Mood normal.        Thought Content: Thought content normal.      UC Treatments / Results  Labs (all labs ordered are listed, but only abnormal results are displayed) Labs Reviewed - No data to display  EKG   Radiology No results found.  Procedures Procedures (including critical care time)  Medications Ordered in UC Medications  dexamethasone (DECADRON)  injection 10 mg (10 mg Intramuscular Given 07/12/22 1326)    Initial Impression / Assessment and Plan / UC Course  I have reviewed the triage vital signs and the nursing notes.  Pertinent labs & imaging results that were available during my care of the patient were reviewed  by me and considered in my medical decision making (see chart for details).     Vital signs and exam reassuring and suggestive of a viral upper respiratory infection.  She declines viral testing today.  Will treat with IM Decadron as she states the cough never went away from a month ago when she had the flu and also Phenergan DM, Flonase, supportive over-the-counter medications and home care.  Return for worsening symptoms. Final Clinical Impressions(s) / UC Diagnoses   Final diagnoses:  Viral URI with cough  Viral bronchitis   Discharge Instructions   None    ED Prescriptions     Medication Sig Dispense Auth. Provider   promethazine-dextromethorphan (PROMETHAZINE-DM) 6.25-15 MG/5ML syrup Take 5 mLs by mouth 4 (four) times daily as needed. 100 mL Volney American, PA-C   fluticasone [facility]) 50 MCG/ACT nasal spray Place 1 spray into both nostrils 2 (two) times daily. 16 g Volney American, [place]      PDMP not reviewed this encounter.   Volney American, [place] 07/12/22 1335

## 2022-07-12 NOTE — ED Triage Notes (Signed)
Pt reports sore throat, left ear, sinus problems chest congestion x 5 days

## 2022-07-13 ENCOUNTER — Ambulatory Visit: Payer: Self-pay

## 2022-07-25 ENCOUNTER — Ambulatory Visit
Admission: EM | Admit: 2022-07-25 | Discharge: 2022-07-25 | Disposition: A | Discharge status: Home / Self Care | Payer: No Typology Code available for payment source

## 2022-07-25 ENCOUNTER — Encounter: Payer: Self-pay | Admitting: Emergency Medicine

## 2022-07-25 ENCOUNTER — Telehealth: Payer: Self-pay | Admitting: Family Medicine

## 2022-07-25 DIAGNOSIS — J22 Unspecified acute lower respiratory infection: Secondary | ICD-10-CM | POA: Diagnosis not present

## 2022-07-25 MED ORDER — AZITHROMYCIN 250 MG PO TABS: ORAL_TABLET | ORAL | 0 refills | Status: DC

## 2022-07-25 MED ORDER — FLUTICASONE PROPIONATE HFA 110 MCG/ACT IN AERO: 2.0000 | INHALATION_SPRAY | Freq: Two times a day (BID) | RESPIRATORY_TRACT | 0 refills | Status: DC

## 2022-07-25 MED ORDER — PROMETHAZINE-DM 6.25-15 MG/5ML PO SYRP: 5.0000 mL | ORAL_SOLUTION | Freq: Four times a day (QID) | ORAL | 0 refills | Status: DC | PRN

## 2022-07-25 MED ORDER — FLUTICASONE-SALMETEROL 100-50 MCG/ACT IN AEPB: 1.0000 | INHALATION_SPRAY | Freq: Two times a day (BID) | RESPIRATORY_TRACT | 0 refills | Status: DC

## 2022-07-25 NOTE — ED Triage Notes (Signed)
Cough and feels SOB since Christmas time.  States she was diagnosed with bronchitis the last time she was here.

## 2022-07-25 NOTE — ED Provider Notes (Signed)
[facility]    CSN: [id number] Arrival date & time: 07/25/22  1056      History   Chief Complaint No chief complaint on file.   HPI Dominique Sandoval is a 57 y.o. female.   Patient presenting today with over a month of ongoing congestion, cough, chest tightness, shortness of breath.  Was diagnosed with bronchitis several weeks ago, was given a shot of Decadron and cough syrup and states the Decadron helped for several days afterward but symptoms returned once it wore off.  Denies chest pain, abdominal pain, nausea vomiting diarrhea, fevers.  History of seasonal allergies on antihistamines, nasal sprays.  No known chronic pulmonary disease.    Past Medical History:  Diagnosis Date   Anxiety    Carpal tunnel syndrome of right wrist    Mass of right wrist    Osteopenia    Wears contact lenses     Patient Active Problem List   Diagnosis Date Noted   History of adenomatous polyp of colon 11/30/2019   Internal hemorrhoids 11/30/2019   Osteopenia of multiple sites 10/15/2019   Anxiety 04/19/2018   Generalized abdominal pain 04/19/2018   DDD (degenerative disc disease), cervical 07/20/2017   Neural foraminal stenosis of cervical spine 07/20/2017   Cervical spondylosis without myelopathy 07/20/2017   Arthropathy of cervical facet joint 07/20/2017   Constipation 03/28/2017   Gastroesophageal reflux disease without esophagitis 03/28/2017   Primary insomnia 09/19/2016   BENIGN NEOPLASM OF ADRENAL GLAND 10/07/2007    Past Surgical History:  Procedure Laterality Date   CARPAL TUNNEL RELEASE Right 02/02/2014   Procedure: RIGHT CARPAL TUNNEL RELEASE;  Surgeon: Linna Hoff, MD;  Location: [facility];  Service: Orthopedics;  Laterality: Right;   CESAREAN SECTION  may 2000   LAPAROSCOPIC ASSISTED VAGINAL HYSTERECTOMY  04-07-2002   w/  left salpingectomy   MASS EXCISION Right 02/02/2014   Procedure: RIGHT WRIST DEEP  MASS EXCISION ;  Surgeon: Linna Hoff, MD;  Location: [facility];  Service: Orthopedics;  Laterality: Right;   TONSILLECTOMY  age 3    OB History   No obstetric history on file.    Home Medications    Prior to Admission medications   Medication Sig Start Date End Date Taking? Authorizing Provider  azithromycin (ZITHROMAX) 250 MG tablet Take first 2 tablets together, then 1 every day until finished. 07/25/22  Yes Volney American, PA-C  fluticasone (FLOVENT HFA) 110 MCG/ACT inhaler Inhale 2 puffs into the lungs 2 (two) times daily. Rinse mouth with water after each use 07/25/22  Yes Volney American, PA-C  levocetirizine (XYZAL) 5 MG tablet Take 5 mg by mouth every evening.   Yes [provider]  promethazine-dextromethorphan (PROMETHAZINE-DM) 6.25-15 MG/5ML syrup Take 5 mLs by mouth 4 (four) times daily as needed. 07/25/22  Yes Volney American, PA-C  escitalopram (LEXAPRO) 10 MG tablet Take 1 tablet (10 mg total) by mouth daily. 11/22/20   Loman Brooklyn, FNP  fluticasone (FLONASE) 50 MCG/ACT nasal spray Place 1 spray into both nostrils 2 (two) times daily. 07/12/22   Volney American, PA-C  omeprazole (PRILOSEC) 20 MG capsule Take 1 capsule (20 mg total) by mouth 2 (two) times daily before a meal. 10/11/19   Loman Brooklyn, FNP  oseltamivir (TAMIFLU) 75 MG capsule Take 1 capsule (75 mg total) by mouth every 12 (twelve) hours. 06/21/22   Leath-Warren, Alda Lea, NP  pantoprazole (PROTONIX) 40 MG tablet Take 40  mg by mouth daily.    [provider]  promethazine-dextromethorphan (PROMETHAZINE-DM) 6.25-15 MG/5ML syrup Take 5 mLs by mouth 4 (four) times daily as needed. 07/12/22   Volney American, PA-C  propranolol (INDERAL) 40 MG tablet Take 40 mg by mouth 3 (three) times daily.    [provider]   Family History History reviewed. No pertinent family history.  Social History Social History   Tobacco Use   Smoking status: Former    Packs/day:  0.25    Years: 15.00    Total pack years: 3.75    Types: Cigarettes    Quit date: 01/27/2004    Years since quitting: 18.5   Smokeless tobacco: Never  Vaping Use   Vaping Use: Never used  Substance Use Topics   Alcohol use: Yes    Comment: occasional   Drug use: No    Allergies   Ciprofloxacin, Other, Robitussin (alcohol free) [guaifenesin], and Wellbutrin [bupropion]   Review of Systems Review of Systems PER HPI  Physical Exam Triage Vital Signs ED Triage Vitals  Enc Vitals Group     BP 07/25/22 1104 126/82     Pulse Rate 07/25/22 1104 81     Resp 07/25/22 1104 18     Temp 07/25/22 1104 98.1 F (36.7 C)     Temp Source 07/25/22 1104 Oral     SpO2 07/25/22 1104 95 %     Weight --      Height --      Head Circumference --      Peak Flow --      Pain Score 07/25/22 1105 0     Pain Loc --      Pain Edu? --      Excl. in [place]? --    No data found.  Updated Vital Signs BP 126/82 (BP Location: Right Arm)   Pulse 81   Temp 98.1 F (36.7 C) (Oral)   Resp 18   SpO2 95%   Visual Acuity Right Eye Distance:   Left Eye Distance:   Bilateral Distance:    Right Eye Near:   Left Eye Near:    Bilateral Near:     Physical Exam Vitals and nursing note reviewed.  Constitutional:      Appearance: Normal appearance.  HENT:     Head: Atraumatic.     Right Ear: Tympanic membrane and external ear normal.     Left Ear: Tympanic membrane and external ear normal.     Nose: Congestion present.     Mouth/Throat:     Mouth: Mucous membranes are moist.     Pharynx: Posterior oropharyngeal erythema present.  Eyes:     Extraocular Movements: Extraocular movements intact.     Conjunctiva/sclera: Conjunctivae normal.  Cardiovascular:     Rate and Rhythm: Normal rate and regular rhythm.     Heart sounds: Normal heart sounds.  Pulmonary:     Effort: Pulmonary effort is normal.     Breath sounds: Normal breath sounds. No wheezing or rales.  Musculoskeletal:        General:  Normal range of motion.     Cervical back: Normal range of motion and neck supple.  Skin:    General: Skin is warm and dry.  Neurological:     Mental Status: She is alert and oriented to person, place, and time.  Psychiatric:        Mood and Affect: Mood normal.        Thought Content: Thought  content normal.    UC Treatments / Results  Labs (all labs ordered are listed, but only abnormal results are displayed) Labs Reviewed - No data to display  EKG  Radiology No results found.  Procedures Procedures (including critical care time)  Medications Ordered in UC Medications - No data to display  Initial Impression / Assessment and Plan / UC Course  I have reviewed the triage vital signs and the nursing notes.  Pertinent labs & imaging results that were available during my care of the patient were reviewed by me and considered in my medical decision making (see chart for details).     Urine duration and worsening course, will start azithromycin in addition to Flovent inhaler, Phenergan DM.  Discussed continued allergy regimen, supportive over-the-counter medications and home care.  Return for worsening symptoms.  Final Clinical Impressions(s) / UC Diagnoses   Final diagnoses:  Lower respiratory infection     Discharge Instructions      Continue your daily antihistamine as well as nasal spray twice daily, humidifiers, sinus rinses as needed.  I have added a steroid inhaler, antibiotics and Phenergan DM cough syrup.  Follow-up for worsening or unresolving symptoms.    ED Prescriptions     Medication Sig Dispense Auth. Provider   fluticasone (FLOVENT HFA) 110 MCG/ACT inhaler Inhale 2 puffs into the lungs 2 (two) times daily. Rinse mouth with water after each use 1 each Volney American, PA-C   azithromycin (ZITHROMAX) 250 MG tablet Take first 2 tablets together, then 1 every day until finished. 6 tablet Volney American, [place]   promethazine-dextromethorphan  (PROMETHAZINE-DM) 6.25-15 MG/5ML syrup Take 5 mLs by mouth 4 (four) times daily as needed. 100 mL Volney American, [place]      PDMP not reviewed this encounter.   Volney American, [place] 07/25/22 1156

## 2022-07-25 NOTE — Discharge Instructions (Signed)
Continue your daily antihistamine as well as nasal spray twice daily, humidifiers, sinus rinses as needed.  I have added a steroid inhaler, antibiotics and Phenergan DM cough syrup.  Follow-up for worsening or unresolving symptoms.

## 2022-07-25 NOTE — Telephone Encounter (Signed)
Patient requesting change of inhaler due to cost.  Advair sent

## 2023-02-19 ENCOUNTER — Encounter: Payer: Self-pay | Admitting: Urology

## 2023-02-19 ENCOUNTER — Ambulatory Visit: Payer: No Typology Code available for payment source | Admitting: Urology

## 2023-02-19 VITALS — BP 137/87 | HR 76 | Temp 98.6°F

## 2023-02-19 DIAGNOSIS — R3129 Other microscopic hematuria: Secondary | ICD-10-CM

## 2023-02-19 DIAGNOSIS — Z87891 Personal history of nicotine dependence: Secondary | ICD-10-CM | POA: Diagnosis not present

## 2023-02-19 LAB — URINALYSIS, ROUTINE W REFLEX MICROSCOPIC
Bilirubin, UA: NEGATIVE
Glucose, UA: NEGATIVE
Ketones, UA: NEGATIVE
Nitrite, UA: NEGATIVE
Protein,UA: NEGATIVE
Specific Gravity, UA: <1.005 — ABNORMAL LOW (ref 1.005–1.030)
Urobilinogen, Ur: 0.2 mg/dL (ref 0.2–1.0)
pH, UA: 6 (ref 5.0–7.5)

## 2023-02-19 LAB — BLADDER SCAN AMB NON-IMAGING: Scan Result: 0

## 2023-02-19 LAB — MICROSCOPIC EXAMINATION: Bacteria, UA: NONE SEEN

## 2023-02-19 NOTE — Patient Instructions (Signed)
PLEASE CALL [phone] TO SCHEDULE RENAL ULTRASOUND

## 2023-02-19 NOTE — Progress Notes (Signed)
Name: Dominique Sandoval DOB: 11/24/1965 MRN: [id number]  History of Present Illness: [name] is a 57 y.o. female who presents today as a new patient at [facility] [place].  She reports chief complaint of microscopic hematuria. - 01/14/2023: UA showed 11-30 RBC/hpf and calcium oxalate crystals; no evidence of UTI.  Today: She reports that she has been aware of having microscopic hematuria for many years. Denies increased urinary urgency, frequency, nocturia, dysuria, gross hematuria, hesitancy, straining to void, or sensations of incomplete emptying. She denies abdominal or flank pain.   She denies prior history of gross hematuria.  She denies history of kidney stones.  She denies history of pyelonephritis.  She denies history of recent or recurrent UTI. She denies history of GU malignancy or pelvic radiation.  She denies history of autoimmune disease. She reports history of smoking (quit smoking routinely in 2005; smoked 1/3 ppd x15 years). She denies known occupational risks. She denies taking anticoagulants.  Fall Screening: Do you usually have a device to assist in your mobility? No   Medications: Current Outpatient Medications  Medication Sig Dispense Refill   escitalopram (LEXAPRO) 10 MG tablet Take 1 tablet (10 mg total) by mouth daily. 90 tablet 1   levocetirizine (XYZAL) 5 MG tablet Take 5 mg by mouth every evening.     pantoprazole (PROTONIX) 40 MG tablet Take 40 mg by mouth daily.     azithromycin (ZITHROMAX) 250 MG tablet Take first 2 tablets together, then 1 every day until finished. (Patient not taking: Reported on 02/19/2023) 6 tablet 0   fluticasone (FLONASE) 50 MCG/ACT nasal spray Place 1 spray into both nostrils 2 (two) times daily. (Patient not taking: Reported on 02/19/2023) 16 g 2   fluticasone (FLOVENT HFA) 110 MCG/ACT inhaler Inhale 2 puffs into the lungs 2 (two) times daily. Rinse mouth with water after each use (Patient not taking: Reported on  02/19/2023) 1 each 0   fluticasone-salmeterol (ADVAIR DISKUS) 100-50 MCG/ACT AEPB Inhale 1 puff into the lungs 2 (two) times daily. (Patient not taking: Reported on 02/19/2023) 60 each 0   omeprazole (PRILOSEC) 20 MG capsule Take 1 capsule (20 mg total) by mouth 2 (two) times daily before a meal. (Patient not taking: Reported on 02/19/2023) 180 capsule 2   oseltamivir (TAMIFLU) 75 MG capsule Take 1 capsule (75 mg total) by mouth every 12 (twelve) hours. (Patient not taking: Reported on 02/19/2023) 10 capsule 0   promethazine-dextromethorphan (PROMETHAZINE-DM) 6.25-15 MG/5ML syrup Take 5 mLs by mouth 4 (four) times daily as needed. (Patient not taking: Reported on 02/19/2023) 100 mL 0   promethazine-dextromethorphan (PROMETHAZINE-DM) 6.25-15 MG/5ML syrup Take 5 mLs by mouth 4 (four) times daily as needed. (Patient not taking: Reported on 02/19/2023) 100 mL 0   propranolol (INDERAL) 40 MG tablet Take 40 mg by mouth 3 (three) times daily. (Patient not taking: Reported on 02/19/2023)     No current facility-administered medications for this visit.    Allergies: Allergies  Allergen Reactions   Ciprofloxacin Hives   Other Rash    Robitussin   Robitussin (Alcohol Free) [Guaifenesin] Hives   Wellbutrin [Bupropion] Other (See Comments)    Chest pain.    Past Medical History:  Diagnosis Date   Anxiety    Carpal tunnel syndrome of right wrist    Colon polyp    Mass of right wrist    Osteopenia    Wears contact lenses    Past Surgical History:  Procedure Laterality Date   CARPAL TUNNEL  RELEASE Right 02/02/2014   Procedure: RIGHT CARPAL TUNNEL RELEASE;  Surgeon: Sharma Covert, MD;  Location: [facility];  Service: Orthopedics;  Laterality: Right;   CESAREAN SECTION  may 2000   LAPAROSCOPIC ASSISTED VAGINAL HYSTERECTOMY  04-07-2002   w/  left salpingectomy   MASS EXCISION Right 02/02/2014   Procedure: RIGHT WRIST DEEP  MASS EXCISION ;  Surgeon: Sharma Covert, MD;  [facility];  Service: Orthopedics;  Laterality: Right;   TONSILLECTOMY  age 34   History reviewed. No pertinent family history. Social History   Socioeconomic History   Marital status: Married    Spouse name: Not on file   Number of children: Not on file   Years of education: Not on file   Highest education level: Not on file  Occupational History   Not on file  Tobacco Use   Smoking status: Former    Current packs/day: 0.00    Average packs/day: 0.3 packs/day for 15.0 years (3.8 ttl pk-yrs)    Types: Cigarettes    Start date: 01/26/1989    Quit date: 01/27/2004    Years since quitting: 19.0   Smokeless tobacco: Never  Vaping Use   Vaping status: Never Used  Substance and Sexual Activity   Alcohol use: Yes    Alcohol/week: 1.0 standard drink of alcohol    Types: 1 Shots of liquor per week    Comment: occasional   Drug use: No   Sexual activity: Yes  Other Topics Concern   Not on file  Social History Narrative   Not on file   Social Determinants of Health   Financial Resource Strain: Not on file  Food Insecurity: Not on file  Transportation Needs: Not on file  Physical Activity: Not on file  Stress: Not on file  Social Connections: Not on file  Intimate Partner Violence: Not on file    SUBJECTIVE  Review of Systems Constitutional: Patient denies any unintentional weight loss or change in strength lntegumentary: Patient denies any rashes or pruritus Cardiovascular: Patient denies chest pain or syncope Respiratory: Patient denies shortness of breath Gastrointestinal: Patient denies nausea, vomiting, constipation, or diarrhea Musculoskeletal: Patient denies muscle cramps or weakness Neurologic: Patient denies convulsions or seizures Psychiatric: Patient denies memory problems Allergic/Immunologic: Patient denies recent allergic reaction(s) Hematologic/Lymphatic: Patient denies bleeding tendencies Endocrine: Patient denies heat/cold intolerance  GU: As per  HPI.  OBJECTIVE Vitals:   02/19/23 1036  BP: 137/87  Pulse: 76  Temp: 98.6 F (37 C)   There is no height or weight on file to calculate BMI.  Physical Examination Constitutional: No obvious distress; patient is non-toxic appearing  Cardiovascular: No visible lower extremity edema.  Respiratory: The patient does not have audible wheezing/stridor; respirations do not appear labored  Gastrointestinal: Abdomen non-distended Musculoskeletal: Normal ROM of UEs  Skin: No obvious rashes/open sores  Neurologic: CN 2-12 grossly intact Psychiatric: Answered questions appropriately with normal affect  Hematologic/Lymphatic/Immunologic: No obvious bruises or sites of spontaneous bleeding  UA: 3-10 RBC/hpf, otherwise normal urine microscopy PVR: 0 ml  ASSESSMENT Microscopic hematuria - Plan: Urinalysis, Routine w reflex microscopic, BLADDER SCAN AMB NON-IMAGING, US RENAL  Former smoker - Plan: US RENAL  For asymptomatic microscopic hematuria we discussed possible etiologies including but not limited to: vigorous exercise, sexual activity, stone, trauma, blood thinner use, urinary tract infection, urethral irritation secondary to vaginal atrophy, chronic kidney disease, glomerulonephropathy, malignancy. We discussed pt's smoking as a risk factor for GU cancer and encouraged  continued smoking cessation.  We reviewed the AUA 2020 [facility] guideline and risk stratification for this patient. Based on individual risk factors, pt was advised that the recommended workup includes RUS and cystoscopy. Pt decided to pursue this work-up and follow-up afterward to discuss the results and formulate a treatment plan based on the findings. All questions were answered.   PLAN Advised the following: RUS ordered. 2. Return for 1st available cystoscopy with any urology MD.  Orders Placed This Encounter  Procedures   US RENAL    Standing Status:   Future    Standing Expiration Date:   02/19/2024    Order  Specific Question:   Reason for Exam (SYMPTOM  OR DIAGNOSIS REQUIRED)    Answer:   kidney stone known or suspected    Order Specific Question:   Preferred imaging location?    Answer:   [facility]   Urinalysis, Routine w reflex microscopic   BLADDER SCAN AMB NON-IMAGING    It has been explained that the patient is to follow regularly with their PCP in addition to all other providers involved in their care and to follow instructions provided by these respective offices. Patient advised to contact urology clinic if any urologic-pertaining questions, concerns, new symptoms or problems arise in the interim period.  There are no Patient Instructions on file for this visit.  Electronically signed by:  Donnita Falls, MSN, FNP-C, CUNP 02/19/2023 10:56 AM

## 2023-03-27 ENCOUNTER — Ambulatory Visit (HOSPITAL_COMMUNITY)
Admission: RE | Admit: 2023-03-27 | Discharge: 2023-03-27 | Disposition: A | Discharge status: Home / Self Care | Payer: No Typology Code available for payment source | Source: Ambulatory Visit | Attending: Urology | Admitting: Urology

## 2023-03-27 DIAGNOSIS — R3129 Other microscopic hematuria: Secondary | ICD-10-CM | POA: Diagnosis present

## 2023-03-27 DIAGNOSIS — Z87891 Personal history of nicotine dependence: Secondary | ICD-10-CM | POA: Insufficient documentation

## 2023-05-06 ENCOUNTER — Ambulatory Visit (INDEPENDENT_AMBULATORY_CARE_PROVIDER_SITE_OTHER): Payer: No Typology Code available for payment source | Admitting: Urology

## 2023-05-06 VITALS — BP 110/71 | HR 84

## 2023-05-06 DIAGNOSIS — R3129 Other microscopic hematuria: Secondary | ICD-10-CM | POA: Diagnosis not present

## 2023-05-06 LAB — URINALYSIS, ROUTINE W REFLEX MICROSCOPIC
Bilirubin, UA: NEGATIVE
Glucose, UA: NEGATIVE
Ketones, UA: NEGATIVE
Leukocytes,UA: NEGATIVE
Nitrite, UA: NEGATIVE
Protein,UA: NEGATIVE
Specific Gravity, UA: 1.025 (ref 1.005–1.030)
Urobilinogen, Ur: 0.2 mg/dL (ref 0.2–1.0)
pH, UA: 6 (ref 5.0–7.5)

## 2023-05-06 LAB — MICROSCOPIC EXAMINATION

## 2023-05-06 MED ORDER — CEPHALEXIN 250 MG PO CAPS
500.0000 mg | ORAL_CAPSULE | Freq: Once | ORAL | Status: AC
  Administered 2023-05-06: 500 mg via ORAL

## 2023-05-06 NOTE — Progress Notes (Signed)
   05/06/23  CC: microhematuria   HPI: Ms Dominique Sandoval is a 57yo here for cystoscopy for microhematuria Blood pressure 110/71, pulse 84. NED. A&Ox3.   No respiratory distress   Abd soft, NT, ND Normal external genitalia with patent urethral meatus  Cystoscopy Procedure Note  Patient identification was confirmed, informed consent was obtained, and patient was prepped using Betadine solution.  Lidocaine jelly was administered per urethral meatus.    Procedure: - Flexible cystoscope introduced, without any difficulty.   - Thorough search of the bladder revealed:    normal urethral meatus    normal urothelium    no stones    no ulcers     no tumors    no urethral polyps    no trabeculation  - Ureteral orifices were normal in position and appearance.  Post-Procedure: - Patient tolerated the procedure well  Assessment/ Plan: Urine for cytology due to occupational exposure and smoking history.    No follow-ups on file.  Wilkie Aye, MD

## 2023-05-07 LAB — CYTOLOGY, URINE

## 2023-05-10 NOTE — Patient Instructions (Signed)

## 2023-11-04 ENCOUNTER — Ambulatory Visit: Payer: No Typology Code available for payment source | Admitting: Urology
